# Patient Record
Sex: Male | Born: 1965 | Race: White | Hispanic: No | Marital: Married | Smoking: Never smoker
Health system: Southern US, Community
[De-identification: ages and names within clinical notes are randomized; demographics above are authoritative.]

## PROBLEM LIST (undated history)

## (undated) DIAGNOSIS — I2699 Other pulmonary embolism without acute cor pulmonale: Secondary | ICD-10-CM

## (undated) DIAGNOSIS — C189 Malignant neoplasm of colon, unspecified: Secondary | ICD-10-CM

## (undated) DIAGNOSIS — D126 Benign neoplasm of colon, unspecified: Secondary | ICD-10-CM

## (undated) DIAGNOSIS — D649 Anemia, unspecified: Secondary | ICD-10-CM

## (undated) DIAGNOSIS — T7840XA Allergy, unspecified, initial encounter: Secondary | ICD-10-CM

## (undated) HISTORY — DX: Malignant neoplasm of colon, unspecified: C18.9

## (undated) HISTORY — PX: WISDOM TOOTH EXTRACTION: SHX21

## (undated) HISTORY — DX: Allergy, unspecified, initial encounter: T78.40XA

## (undated) HISTORY — DX: Other pulmonary embolism without acute cor pulmonale: I26.99

## (undated) HISTORY — DX: Benign neoplasm of colon, unspecified: D12.6

## (undated) HISTORY — DX: Anemia, unspecified: D64.9

## (undated) HISTORY — PX: POLYPECTOMY: SHX149

## (undated) HISTORY — PX: COLON SURGERY: SHX602

---

## 2004-06-14 ENCOUNTER — Ambulatory Visit (HOSPITAL_COMMUNITY): Admission: RE | Admit: 2004-06-14 | Discharge: 2004-06-14 | Payer: Self-pay | Admitting: Urology

## 2004-06-14 ENCOUNTER — Encounter (INDEPENDENT_AMBULATORY_CARE_PROVIDER_SITE_OTHER): Payer: Self-pay | Admitting: Specialist

## 2004-06-14 ENCOUNTER — Ambulatory Visit (HOSPITAL_BASED_OUTPATIENT_CLINIC_OR_DEPARTMENT_OTHER): Admission: RE | Admit: 2004-06-14 | Discharge: 2004-06-14 | Payer: Self-pay | Admitting: Urology

## 2004-08-06 ENCOUNTER — Ambulatory Visit: Payer: Self-pay | Admitting: Family Medicine

## 2005-07-14 ENCOUNTER — Ambulatory Visit: Payer: Self-pay | Admitting: Family Medicine

## 2005-10-13 ENCOUNTER — Ambulatory Visit: Payer: Self-pay | Admitting: Family Medicine

## 2005-10-16 ENCOUNTER — Ambulatory Visit: Payer: Self-pay

## 2007-05-13 ENCOUNTER — Telehealth: Payer: Self-pay | Admitting: Family Medicine

## 2008-02-08 ENCOUNTER — Ambulatory Visit: Payer: Self-pay | Admitting: Family Medicine

## 2008-02-08 ENCOUNTER — Ambulatory Visit: Payer: Self-pay | Admitting: Cardiology

## 2008-02-08 DIAGNOSIS — J45909 Unspecified asthma, uncomplicated: Secondary | ICD-10-CM | POA: Insufficient documentation

## 2008-02-08 DIAGNOSIS — G500 Trigeminal neuralgia: Secondary | ICD-10-CM

## 2008-02-16 ENCOUNTER — Telehealth: Payer: Self-pay | Admitting: Family Medicine

## 2008-02-25 ENCOUNTER — Ambulatory Visit: Payer: Self-pay | Admitting: Family Medicine

## 2008-06-09 ENCOUNTER — Ambulatory Visit: Payer: Self-pay | Admitting: Family Medicine

## 2008-06-09 DIAGNOSIS — J209 Acute bronchitis, unspecified: Secondary | ICD-10-CM | POA: Insufficient documentation

## 2010-02-27 ENCOUNTER — Ambulatory Visit: Payer: Self-pay | Admitting: Family Medicine

## 2010-02-27 DIAGNOSIS — R109 Unspecified abdominal pain: Secondary | ICD-10-CM | POA: Insufficient documentation

## 2010-02-28 ENCOUNTER — Encounter: Payer: Self-pay | Admitting: Internal Medicine

## 2010-04-02 ENCOUNTER — Ambulatory Visit: Payer: Self-pay | Admitting: Internal Medicine

## 2010-04-02 DIAGNOSIS — R195 Other fecal abnormalities: Secondary | ICD-10-CM

## 2010-04-02 DIAGNOSIS — R1084 Generalized abdominal pain: Secondary | ICD-10-CM

## 2010-04-02 DIAGNOSIS — R1012 Left upper quadrant pain: Secondary | ICD-10-CM | POA: Insufficient documentation

## 2010-04-03 ENCOUNTER — Ambulatory Visit: Payer: Self-pay | Admitting: Internal Medicine

## 2010-04-03 ENCOUNTER — Encounter (INDEPENDENT_AMBULATORY_CARE_PROVIDER_SITE_OTHER): Payer: Self-pay | Admitting: *Deleted

## 2010-04-03 DIAGNOSIS — D509 Iron deficiency anemia, unspecified: Secondary | ICD-10-CM

## 2010-04-03 LAB — CONVERTED CEMR LAB
Basophils Absolute: 0 10*3/uL (ref 0.0–0.1)
Basophils Relative: 0.3 % (ref 0.0–3.0)
Eosinophils Absolute: 0.1 10*3/uL (ref 0.0–0.7)
Eosinophils Relative: 3.1 % (ref 0.0–5.0)
Ferritin: 2 ng/mL — ABNORMAL LOW (ref 22.0–322.0)
Folate: 11.3 ng/mL
HCT: 26.2 % — ABNORMAL LOW (ref 39.0–52.0)
Hemoglobin: 8.3 g/dL — ABNORMAL LOW (ref 13.0–17.0)
Iron: 16 ug/dL — ABNORMAL LOW (ref 42–165)
Lymphocytes Relative: 26.1 % (ref 12.0–46.0)
Lymphs Abs: 1.2 10*3/uL (ref 0.7–4.0)
MCHC: 31.5 g/dL (ref 30.0–36.0)
MCV: 62.9 fL — ABNORMAL LOW (ref 78.0–100.0)
Monocytes Absolute: 0.4 10*3/uL (ref 0.1–1.0)
Monocytes Relative: 8.7 % (ref 3.0–12.0)
Neutro Abs: 2.9 10*3/uL (ref 1.4–7.7)
Neutrophils Relative %: 61.8 % (ref 43.0–77.0)
Platelets: 251 10*3/uL (ref 150.0–400.0)
RBC: 4.17 M/uL — ABNORMAL LOW (ref 4.22–5.81)
RDW: 17.2 % — ABNORMAL HIGH (ref 11.5–14.6)
Vitamin B-12: 381 pg/mL (ref 211–911)
WBC: 4.8 10*3/uL (ref 4.5–10.5)

## 2010-04-06 DIAGNOSIS — C189 Malignant neoplasm of colon, unspecified: Secondary | ICD-10-CM

## 2010-04-06 HISTORY — DX: Malignant neoplasm of colon, unspecified: C18.9

## 2010-04-10 ENCOUNTER — Emergency Department (HOSPITAL_COMMUNITY): Admission: EM | Admit: 2010-04-10 | Discharge: 2010-04-10 | Payer: Self-pay | Admitting: Emergency Medicine

## 2010-04-10 ENCOUNTER — Telehealth: Payer: Self-pay | Admitting: Internal Medicine

## 2010-04-16 ENCOUNTER — Telehealth: Payer: Self-pay | Admitting: Internal Medicine

## 2010-04-16 ENCOUNTER — Ambulatory Visit: Payer: Self-pay | Admitting: Internal Medicine

## 2010-04-16 DIAGNOSIS — R933 Abnormal findings on diagnostic imaging of other parts of digestive tract: Secondary | ICD-10-CM

## 2010-04-17 ENCOUNTER — Telehealth: Payer: Self-pay | Admitting: Internal Medicine

## 2010-04-18 ENCOUNTER — Ambulatory Visit: Payer: Self-pay | Admitting: Cardiology

## 2010-04-18 ENCOUNTER — Ambulatory Visit: Payer: Self-pay | Admitting: Internal Medicine

## 2010-04-19 LAB — CONVERTED CEMR LAB: CEA: 2.9 ng/mL (ref 0.0–5.0)

## 2010-05-03 ENCOUNTER — Encounter: Payer: Self-pay | Admitting: Family Medicine

## 2010-05-07 HISTORY — PX: COLON RESECTION: SHX5231

## 2010-05-21 ENCOUNTER — Encounter (INDEPENDENT_AMBULATORY_CARE_PROVIDER_SITE_OTHER): Payer: Self-pay | Admitting: General Surgery

## 2010-05-21 ENCOUNTER — Encounter: Payer: Self-pay | Admitting: Internal Medicine

## 2010-05-21 ENCOUNTER — Inpatient Hospital Stay (HOSPITAL_COMMUNITY): Admission: RE | Admit: 2010-05-21 | Discharge: 2010-05-25 | Payer: Self-pay | Admitting: General Surgery

## 2010-05-24 ENCOUNTER — Telehealth (INDEPENDENT_AMBULATORY_CARE_PROVIDER_SITE_OTHER): Payer: Self-pay | Admitting: *Deleted

## 2010-05-28 ENCOUNTER — Ambulatory Visit: Payer: Self-pay | Admitting: Oncology

## 2010-06-06 ENCOUNTER — Encounter: Payer: Self-pay | Admitting: Family Medicine

## 2010-06-07 ENCOUNTER — Encounter: Payer: Self-pay | Admitting: Family Medicine

## 2010-06-19 ENCOUNTER — Encounter: Payer: Self-pay | Admitting: Family Medicine

## 2010-06-19 LAB — CBC WITH DIFFERENTIAL/PLATELET
Basophils Absolute: 0.1 10*3/uL (ref 0.0–0.1)
Eosinophils Absolute: 0.2 10*3/uL (ref 0.0–0.5)
HCT: 32.4 % — ABNORMAL LOW (ref 38.4–49.9)
HGB: 10.3 g/dL — ABNORMAL LOW (ref 13.0–17.1)
LYMPH%: 28.2 % (ref 14.0–49.0)
MCV: 64 fL — ABNORMAL LOW (ref 79.3–98.0)
MONO%: 9 % (ref 0.0–14.0)
NEUT#: 2.8 10*3/uL (ref 1.5–6.5)
NEUT%: 56.8 % (ref 39.0–75.0)
Platelets: 173 10*3/uL (ref 140–400)
RBC: 5.07 10*6/uL (ref 4.20–5.82)

## 2010-06-27 ENCOUNTER — Ambulatory Visit (HOSPITAL_COMMUNITY)
Admission: RE | Admit: 2010-06-27 | Discharge: 2010-06-27 | Payer: Self-pay | Source: Home / Self Care | Attending: Oncology | Admitting: Oncology

## 2010-07-11 ENCOUNTER — Ambulatory Visit: Payer: Self-pay | Admitting: Oncology

## 2010-07-15 LAB — COMPREHENSIVE METABOLIC PANEL
ALT: 11 U/L (ref 0–53)
AST: 14 U/L (ref 0–37)
Albumin: 4.3 g/dL (ref 3.5–5.2)
Alkaline Phosphatase: 83 U/L (ref 39–117)
BUN: 14 mg/dL (ref 6–23)
CO2: 26 mEq/L (ref 19–32)
Calcium: 9.5 mg/dL (ref 8.4–10.5)
Chloride: 106 mEq/L (ref 96–112)
Creatinine, Ser: 0.86 mg/dL (ref 0.40–1.50)
Glucose, Bld: 94 mg/dL (ref 70–99)
Potassium: 4.1 mEq/L (ref 3.5–5.3)
Sodium: 141 mEq/L (ref 135–145)
Total Bilirubin: 0.6 mg/dL (ref 0.3–1.2)
Total Protein: 6.8 g/dL (ref 6.0–8.3)

## 2010-07-15 LAB — CBC WITH DIFFERENTIAL/PLATELET
BASO%: 0.4 % (ref 0.0–2.0)
Basophils Absolute: 0 10*3/uL (ref 0.0–0.1)
EOS%: 4.3 % (ref 0.0–7.0)
Eosinophils Absolute: 0.2 10*3/uL (ref 0.0–0.5)
HCT: 35.7 % — ABNORMAL LOW (ref 38.4–49.9)
HGB: 11.3 g/dL — ABNORMAL LOW (ref 13.0–17.1)
LYMPH%: 30.6 % (ref 14.0–49.0)
MCH: 21.6 pg — ABNORMAL LOW (ref 27.2–33.4)
MCHC: 31.7 g/dL — ABNORMAL LOW (ref 32.0–36.0)
MCV: 68.2 fL — ABNORMAL LOW (ref 79.3–98.0)
MONO#: 0.4 10*3/uL (ref 0.1–0.9)
MONO%: 9.2 % (ref 0.0–14.0)
NEUT#: 2.7 10*3/uL (ref 1.5–6.5)
NEUT%: 55.5 % (ref 39.0–75.0)
Platelets: 208 10*3/uL (ref 140–400)
RBC: 5.23 10*6/uL (ref 4.20–5.82)
RDW: 26.1 % — ABNORMAL HIGH (ref 11.0–14.6)
WBC: 4.8 10*3/uL (ref 4.0–10.3)
lymph#: 1.5 10*3/uL (ref 0.9–3.3)

## 2010-07-15 LAB — FERRITIN: Ferritin: 5 ng/mL — ABNORMAL LOW (ref 22–322)

## 2010-07-25 ENCOUNTER — Encounter: Payer: Self-pay | Admitting: Internal Medicine

## 2010-08-05 LAB — CBC WITH DIFFERENTIAL/PLATELET
Basophils Absolute: 0 10*3/uL (ref 0.0–0.1)
Eosinophils Absolute: 0.3 10*3/uL (ref 0.0–0.5)
HGB: 12.4 g/dL — ABNORMAL LOW (ref 13.0–17.1)
MCV: 73.5 fL — ABNORMAL LOW (ref 79.3–98.0)
MONO%: 11.7 % (ref 0.0–14.0)
NEUT#: 3 10*3/uL (ref 1.5–6.5)
Platelets: 201 10*3/uL (ref 140–400)
RDW: 31.2 % — ABNORMAL HIGH (ref 11.0–14.6)

## 2010-08-06 NOTE — Procedures (Signed)
Summary: Colonoscopy  Patient: Brian Cantrell Note: All result statuses are Final unless otherwise noted.  Tests: (1) Colonoscopy (COL)   COL Colonoscopy           DONE     [facility]     [address]     [place], [place]  [address]           COLONOSCOPY PROCEDURE REPORT           PATIENT:  [name], [name]  MR#:  [id number]     BIRTHDATE:  July 28, 1965, 44 yrs. old  GENDER:  male     ENDOSCOPIST:  Wilhemina Bonito. Eda Keys, MD     REF. BY:  Tera Mater. Clent Ridges, M.D.     PROCEDURE DATE:  04/16/2010     PROCEDURE:  Colonoscopy with biopsies,     Colonoscopy with snare polypectomy     x 1     ASA CLASS:  Class I     INDICATIONS:  heme positive stool, Iron deficiency anemia,     Abdominal pain     MEDICATIONS:   Fentanyl 100 mcg IV, Versed 10 mg IV           DESCRIPTION OF PROCEDURE:   After the risks benefits and     alternatives of the procedure were thoroughly explained, informed     consent was obtained.  Digital rectal exam was performed and     revealed no abnormalities.   The [id number] endoscope was     introduced through the anus and advanced to the sigmoid colon,     limited by an obstruction.    The quality of the prep was     excellent, using MoviPrep.  The instrument was then slowly     withdrawn as the colon was fully examined.     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 The scope could     not pass proximal. Multiple biopsies taken.  A 7mm pedunculated     polyp was found in the sigmoid colon. Polyp was snared without     cautery. Retrieval was successful.    Retroflexed views in the     rectum revealed no abnormalities.    The scope was then withdrawn     from the patient and the procedure completed.           COMPLICATIONS:  None           ENDOSCOPIC IMPRESSION:     1) Mass in the sigmoid colon - biopsied     2) Pedunculated polyp in the sigmoid colon -  removed     3) Incomplete examination of colon due to obstruction, see above           RECOMMENDATIONS:     1) Follow up biopsies     2) My office will arrange for you to have a Contrast CT scan of     abdomen and pelvis " Sigmoid colon mass, R/O mets" .     3) CEA level     3) My office will arrange for you to meet with a general surgeon     "sigmoid colectomy".     4) Follow up colonoscopy in 6-12 months           ______________________________     Wilhemina Bonito. Marina Goodell  Montez Hageman, MD           CC:  Nelwyn Salisbury, MD;  The Patient;  [facility]           n.     eSIGNED:   Wilhemina Bonito. Eda Keys at 04/16/2010 09:48 AM           [place], Onalee Hua, [id number]  Note: An exclamation mark (!) indicates a result that was not dispersed into the flowsheet. Document Creation Date: 04/16/2010 9:48 AM _______________________________________________________________________  (1) Order result status: Final Collection or observation date-time: 04/16/2010 09:32 Requested date-time:  Receipt date-time:  Reported date-time:  Referring Physician:   Ordering Physician: Fransico Setters [phone]) Specimen Source:  Source: Launa Grill Order Number: [id number] Lab site:   Appended Document: Orders Update-CT Abd/Pelvis    Clinical Lists Changes  Problems: Added new problem of NONSPECIFIC ABN FINDING RAD & OTH EXAM GI TRACT (ICD-793.4) - Signed Orders: Added new Referral order of CT Abdomen/Pelvis with Contrast (CT Abd/Pelvis w/con) - Signed Added new Test order of [facility] ([facility]) - Signed      Appended Document: Colonoscopy All records faxed to [facility].

## 2010-08-06 NOTE — Letter (Signed)
Summary: New Patient letter  [facility]  [address] [place], [place] [address]   Phone: [phone]  Fax: [phone]       02/28/2010 MRN: [id number]  Brian Cantrell [address] [place], [place]  [address]  Dear [name],  Welcome to the Gastroenterology Division at [facility].    You are scheduled to see Dr.  Yancey Flemings on 04-02-10 at 1:45pm on the 3rd floor at [facility], [address]  We ask that you try to arrive at our office 15 minutes prior to your appointment time to allow for check-in.  We would like you to complete the enclosed self-administered evaluation form prior to your visit and bring it with you on the day of your appointment.  We will review it with you.  Also, please bring a complete list of all your medications or, if you prefer, bring the medication bottles and we will list them.  Please bring your insurance card so that we may make a copy of it.  If your insurance requires a referral to see a specialist, please bring your referral form from your primary care physician.  Co-payments are due at the time of your visit and may be paid by cash, check or credit card.     Your office visit will consist of a consult with your physician (includes a physical exam), any laboratory testing he/she may order, scheduling of any necessary diagnostic testing (e.g. x-ray, ultrasound, CT-scan), and scheduling of a procedure (e.g. Endoscopy, Colonoscopy) if required.  Please allow enough time on your schedule to allow for any/all of these possibilities.    If you cannot keep your appointment, please call [phone] to cancel or reschedule prior to your appointment date.  This allows [place] the opportunity to schedule an appointment for another patient in need of care.  If you do not cancel or reschedule by 5 p.m. the business day prior to your appointment date, you will be charged a $50.00 late cancellation/no-show fee.    Thank you for  choosing [facility] for your medical needs.  We appreciate the opportunity to care for you.  Please visit [place] at our website  to learn more about our practice.                     Sincerely,                                                             The Gastroenterology Division

## 2010-08-06 NOTE — Letter (Signed)
Summary: [facility] Instructions  [facility]  [address] [place], [place] [address]   Phone: [phone]  Fax: [phone]       Brian Cantrell    1965/11/16    MRN: [id number]        Procedure Day /Date:04/16/10 TUE     Arrival Time:730 am     Procedure Time:830 am     Location of Procedure:                    X  [facility] (4th Floor)                        PREPARATION FOR COLONOSCOPY WITH MOVIPREP   Starting 5 days prior to your procedure 04/11/10  do not eat nuts, seeds, popcorn, corn, beans, peas,  salads, or any raw vegetables.  Do not take any fiber supplements (e.g. Metamucil, Citrucel, and Benefiber).  THE DAY BEFORE YOUR PROCEDURE         DATE: 04/15/10  DAY: MON  1.  Drink clear liquids the entire day-NO SOLID FOOD  2.  Do not drink anything colored red or purple.  Avoid juices with pulp.  No orange juice.  3.  Drink at least 64 oz. (8 glasses) of fluid/clear liquids during the day to prevent dehydration and help the prep work efficiently.  CLEAR LIQUIDS INCLUDE: Water Jello Ice Popsicles Tea (sugar ok, no milk/cream) Powdered fruit flavored drinks Coffee (sugar ok, no milk/cream) Gatorade Juice: apple, white grape, white cranberry  Lemonade Clear bullion, consomm, broth Carbonated beverages (any kind) Strained chicken noodle soup Hard Candy                             4.  In the morning, mix first dose of MoviPrep solution:    Empty 1 Pouch A and 1 Pouch B into the disposable container    Add lukewarm drinking water to the top line of the container. Mix to dissolve    Refrigerate (mixed solution should be used within 24 hrs)  5.  Begin drinking the prep at 5:00 p.m. The MoviPrep container is divided by 4 marks.   Every 15 minutes drink the solution down to the next mark (approximately 8 oz) until the full liter is complete.   6.  Follow completed prep with 16 oz of clear liquid of your choice (Nothing red or  purple).  Continue to drink clear liquids until bedtime.  7.  Before going to bed, mix second dose of MoviPrep solution:    Empty 1 Pouch A and 1 Pouch B into the disposable container    Add lukewarm drinking water to the top line of the container. Mix to dissolve    Refrigerate  THE DAY OF YOUR PROCEDURE      DATE: 04/16/10 DAY: TUE  Beginning at 3 a.m. (5 hours before procedure):         1. Every 15 minutes, drink the solution down to the next mark (approx 8 oz) until the full liter is complete.  2. Follow completed prep with 16 oz. of clear liquid of your choice.    3. You may drink clear liquids until 6 am (2 HOURS BEFORE PROCEDURE).   MEDICATION INSTRUCTIONS  Unless otherwise instructed, you should take regular prescription medications with a small sip of water   as early as possible the morning of your procedure.  Additional medication instructions: Stop taking your Iron 5-7 days before your procedure         OTHER INSTRUCTIONS  You will need a responsible adult at least 47 years of age to accompany you and drive you home.   This person must remain in the waiting room during your procedure.  Wear loose fitting clothing that is easily removed.  Leave jewelry and other valuables at home.  However, you may wish to bring a book to read or  an iPod/MP3 player to listen to music as you wait for your procedure to start.  Remove all body piercing jewelry and leave at home.  Total time from sign-in until discharge is approximately 2-3 hours.  You should go home directly after your procedure and rest.  You can resume normal activities the  day after your procedure.  The day of your procedure you should not:   Drive   Make legal decisions   Operate machinery   Drink alcohol   Return to work  You will receive specific instructions about eating, activities and medications before you leave.    The above instructions have been reviewed and explained to me by    Chales Abrahams CMA Duncan Dull)  April 03, 2010 2:09 PM     I fully understand and can verbalize these instructions over the phone mailed to home Date 04/03/10  Appended Document: Moviprep Instructions mailed to the pt

## 2010-08-06 NOTE — Progress Notes (Signed)
Summary: triage / ABD PAIN  Phone Note Call from Patient Call back at Home Phone [phone]   Caller: Patient Call For: Dr. Marina Goodell Reason for Call: Talk to Nurse Summary of Call: in severe abd pain x 3-4 hrs Initial call taken by: Vallarie Mare,  April 10, 2010 1:33 PM  Follow-up for Phone Call        pt having generalized abd pain starting on Friday night lasting several hours, started again this morning at 930 am and has not subsided.  Had a sip of water at 10 am that made the pain worse.  He feels lightheaded and nauseaous.  Has had several very intense salivating episodes.  Pain is a 8-9 at the worst.  Pt is not passing gas, or having any bowel movements.  I advised pt to be seen at the ER. He is going to have someone drive him.   Follow-up by: Chales Abrahams CMA Duncan Dull),  April 10, 2010 1:51 PM  Additional Follow-up for Phone Call Additional follow up Details #1::        AGREE Additional Follow-up by: Hilarie Fredrickson MD,  April 10, 2010 2:09 PM     Appended Document: triage / ABD PAIN He was going to the ER I look for records to see if he actually went.

## 2010-08-06 NOTE — Letter (Signed)
Summary: [facility] Instructions  [facility]  [address] [place], [place] [address]   Phone: [phone]  Fax: [phone]       Brian Cantrell    07-17-1965    MRN: [id number]        Procedure Day Dorna Bloom: Wednesday November 2nd, 2011     Arrival Time: 3:00pm     Procedure Time: 4:00pm     Location of Procedure:                    _ x_  [facility] (4th Floor)                        PREPARATION FOR COLONOSCOPY WITH MOVIPREP   Starting 5 days prior to your procedure9/29/11 do not eat nuts, seeds, popcorn, corn, beans, peas,  salads, or any raw vegetables.  Do not take any fiber supplements (e.g. Metamucil, Citrucel, and Benefiber).  THE DAY BEFORE YOUR PROCEDURE         DATE: 05/07/10 DAY: Tuesday  1.  Drink clear liquids the entire day-NO SOLID FOOD  2.  Do not drink anything colored red or purple.  Avoid juices with pulp.  No orange juice.  3.  Drink at least 64 oz. (8 glasses) of fluid/clear liquids during the day to prevent dehydration and help the prep work efficiently.  CLEAR LIQUIDS INCLUDE: Water Jello Ice Popsicles Tea (sugar ok, no milk/cream) Powdered fruit flavored drinks Coffee (sugar ok, no milk/cream) Gatorade Juice: apple, white grape, white cranberry  Lemonade Clear bullion, consomm, broth Carbonated beverages (any kind) Strained chicken noodle soup Hard Candy                             4.  In the morning, mix first dose of MoviPrep solution:    Empty 1 Pouch A and 1 Pouch B into the disposable container    Add lukewarm drinking water to the top line of the container. Mix to dissolve    Refrigerate (mixed solution should be used within 24 hrs)  5.  Begin drinking the prep at 5:00 p.m. The MoviPrep container is divided by 4 marks.   Every 15 minutes drink the solution down to the next mark (approximately 8 oz) until the full liter is complete.   6.  Follow completed prep with 16 oz of clear liquid of your choice  (Nothing red or purple).  Continue to drink clear liquids until bedtime.  7.  Before going to bed, mix second dose of MoviPrep solution:    Empty 1 Pouch A and 1 Pouch B into the disposable container    Add lukewarm drinking water to the top line of the container. Mix to dissolve    Refrigerate  THE DAY OF YOUR PROCEDURE      DATE: 05/08/10 DAY: Wednesday  Beginning at 11:00 a.m. (5 hours before procedure):         1. Every 15 minutes, drink the solution down to the next mark (approx 8 oz) until the full liter is complete.  2. Follow completed prep with 16 oz. of clear liquid of your choice.    3. You may drink clear liquids until 2:00pm (2 HOURS BEFORE PROCEDURE).   MEDICATION INSTRUCTIONS  Unless otherwise instructed, you should take regular prescription medications with a small sip of water   as early as possible the morning of your procedure.  OTHER INSTRUCTIONS  You will need a responsible adult at least 47 years of age to accompany you and drive you home.   This person must remain in the waiting room during your procedure.  Wear loose fitting clothing that is easily removed.  Leave jewelry and other valuables at home.  However, you may wish to bring a book to read or  an iPod/MP3 player to listen to music as you wait for your procedure to start.  Remove all body piercing jewelry and leave at home.  Total time from sign-in until discharge is approximately 2-3 hours.  You should go home directly after your procedure and rest.  You can resume normal activities the  day after your procedure.  The day of your procedure you should not:   Drive   Make legal decisions   Operate machinery   Drink alcohol   Return to work  You will receive specific instructions about eating, activities and medications before you leave.    The above instructions have been reviewed and explained to me by   Brian Cantrell.     I fully understand and can verbalize these  instructions _____________________________ Date _________

## 2010-08-06 NOTE — Progress Notes (Signed)
  Phone Note Other Incoming   Request: Send information Summary of Call: Records received from [facility] forwarded to Dr. Marina Goodell.        Appended Document:  3 pages

## 2010-08-06 NOTE — Progress Notes (Signed)
Summary: Dr Luisa Hart called  Phone Note Other Incoming   Caller: DR Luisa Hart -Pathology [phone] Reason for Call: Discuss lab or test results Summary of Call: Did not want Dr Marina Goodell interrupted if with a patient. Initial call taken by: Leanor Kail [facility],  April 17, 2010 2:56 PM  Follow-up for Phone Call        I spoke with him. Thanks Follow-up by: Hilarie Fredrickson MD,  April 17, 2010 3:07 PM

## 2010-08-06 NOTE — Assessment & Plan Note (Signed)
Summary: STOMACH ISSUES // RS   Vital Signs:  Patient profile:   45 year old male Weight:      204 pounds BP sitting:   110 / 78  (left arm) Cuff size:   regular  Vitals Entered By: Raechel Ache, RN (February 27, 2010 8:45 AM) CC: C/o lower abd pain x 2 1/2 weeks- was severe and almost called 911 with vomiting. Has since gotten better but continues. Also has lost 10#. BM's were loose and now constipated.   History of Present Illness: Here for 2 weeks of lower abdominal pains, bloating, and changes in BMs. He is mostly constipated, with small firm stools. he often feels like he needs to have a BM but cannot. No fever. He had one episode of nausea and vomitting 2 weeks ago after eating at a restaurant, but none since. No urinary symptoms. He says he has been under tremendous stress at work for several weeks.   Allergies (verified): No Known Drug Allergies  Past History:  Past Medical History: Reviewed history from 02/08/2008 and no changes required. Asthma  Past Surgical History: Reviewed history from 02/08/2008 and no changes required. Denies surgical history  Family History: Reviewed history from 02/08/2008 and no changes required. Family History of CAD Male 1st degree relative <50  Review of Systems  The patient denies anorexia, fever, weight loss, weight gain, vision loss, decreased hearing, hoarseness, chest pain, syncope, dyspnea on exertion, peripheral edema, prolonged cough, headaches, hemoptysis, melena, hematochezia, severe indigestion/heartburn, hematuria, incontinence, genital sores, muscle weakness, suspicious skin lesions, transient blindness, difficulty walking, depression, unusual weight change, abnormal bleeding, enlarged lymph nodes, angioedema, breast masses, and testicular masses.    Physical Exam  General:  Well-developed,well-nourished,in no acute distress; alert,appropriate and cooperative throughout examination Lungs:  Normal respiratory effort, chest  expands symmetrically. Lungs are clear to auscultation, no crackles or wheezes. Heart:  Normal rate and regular rhythm. S1 and S2 normal without gallop, murmur, click, rub or other extra sounds. Abdomen:  soft, normal bowel sounds, no distention, no masses, no guarding, no rigidity, no rebound tenderness, no abdominal hernia, no inguinal hernia, no hepatomegaly, and no splenomegaly.  Mildly tender in both lower quadrants. Rectal:  No external abnormalities noted. Normal sphincter tone. No rectal masses or tenderness. Heme positive. Prostate:  Prostate gland firm and smooth, no enlargement, nodularity, tenderness, mass, asymmetry or induration.   Impression & Recommendations:  Problem # 1:  ABDOMINAL PAIN (ICD-789.00)  Orders: Hemoccult Guaiac-1 spec.(in office) ([address]) Gastroenterology Referral (GI)  Patient Instructions: 1)  This could be consistent with IBS, but with the recent onset of changes and heme positive stool I think a colonoscopy would be a good idea. We will set this up. I advised more fiber in his diet.

## 2010-08-06 NOTE — Assessment & Plan Note (Addendum)
Summary: ABD PAIN & HEM POSITIVE STOOLS   History of Present Illness Visit Type: consult Primary GI MD: Brian Flemings MD Primary Provider: Gershon Cantrell, M.D. Requesting Provider: Gershon Crane, MD Chief Complaint: lower abdominal pain that is improving, heme + stool at Dr. Claris Cantrell office History of Present Illness:   10 or old white male with a history of asthma who presents today regarding recent problems with abdominal pain and Hemoccult-positive stool. Patient's history dates back to July when after eating a meal he developed significant lower abdominal pain. This was associated with vomiting. He assumed was food poisoning. Thereafter, problems with intermittent lower abdominal pain and change in bowel habits. Initially easy somewhat more frequent bowel habits followed by constipation. Has continued with an alternating but improving pattern since. No obvious bleeding. He has had 14 pound weight loss since late July, the future dates this to diet. Physical examination with his primary care physician revealed Hemoccult positive stool. Patient's GI review of systems is otherwise negative. No family history of colon cancer. No prior history of GI evaluations.. No NSAID use.   GI Review of Systems    Reports abdominal pain.     Location of  Abdominal pain: lower abdomen.    Denies acid reflux, belching, bloating, chest pain, dysphagia with liquids, dysphagia with solids, heartburn, loss of appetite, nausea, vomiting, vomiting blood, weight loss, and  weight gain.      Reports change in bowel habits and  constipation.     Denies anal fissure, black tarry stools, diarrhea, diverticulosis, fecal incontinence, heme positive stool, hemorrhoids, irritable bowel syndrome, jaundice, light color stool, liver problems, rectal bleeding, and  rectal pain.    Current Medications (verified): 1)  Green Vibrance Powder .... Use Daily  Allergies (verified): No Known Drug Allergies  Past History:  Past Medical  History: Reviewed history from 02/08/2008 and no changes required. Asthma  Past Surgical History: Reviewed history from 02/08/2008 and no changes required. 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 Alcohol use-yes Drug use-no Daily Caffeine Use 0.25 cups  Review of Systems  The patient denies allergy/sinus, anemia, anxiety-new, arthritis/joint pain, back pain, blood in urine, breast changes/lumps, confusion, cough, coughing up blood, depression-new, fainting, fatigue, fever, headaches-new, hearing problems, heart murmur, heart rhythm changes, itching, muscle pains/cramps, night sweats, nosebleeds, shortness of breath, skin rash, sleeping problems, sore throat, swelling of feet/legs, swollen lymph glands, thirst - excessive, urination - excessive, urination changes/pain, urine leakage, vision changes, and voice change.    Vital Signs:  Patient profile:   45 year old male Height:      73 inches Weight:      203 pounds BMI:     26.88 Pulse rate:   76 / minute Pulse rhythm:   regular BP sitting:   120 / 70  (left arm) Cuff size:   regular  Vitals Entered By: Brian Cantrell CMA Brian Cantrell) (April 02, 2010 1:58 PM)  Physical Exam  General:  Well developed, well nourished, no acute distress. Head:  Normocephalic and atraumatic. Eyes:  PERRLA, no icterus. Ears:  Normal auditory acuity. Nose:  No deformity, discharge,  or lesions. Mouth:  No deformity or lesions, dentition normal. Neck:  Supple; no masses or thyromegaly. Lungs:  Clear throughout to auscultation. Heart:  Regular rate and rhythm; no murmurs, rubs,  or bruits. Abdomen:  Soft, nontender and nondistended.  No masses, hepatosplenomegaly or hernias noted. Normal bowel sounds. Rectal:  deferred until colonoscopy Msk:  Symmetrical with no gross deformities. Normal  posture. Pulses:  Normal pulses noted. Extremities:  No clubbing, cyanosis, edema or deformities noted. Neurologic:  Alert and  oriented x4. Skin:  Intact without significant lesions or rashes. Psych:  Alert and cooperative. Normal mood and affect.   Impression & Recommendations:  Problem # 1:  ABDOMINAL PAIN -GENERALIZED (ICD-789.07) recent problems with abdominal discomfort and associated change in bowel habits (787.99). Problem has improved with time. 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 Recommend ongoing expectant management.  Problem # 2:  NONSPECIFIC ABNORMAL FINDING IN STOOL CONTENTS (ICD-792.1) Hemoccult-positive stool in a gentleman with abdominal discomfort and altered bowel habits as described. Rule out underlying mucosal pathology. Rule out anemia.  Plan: #1. CBC #2. Colonoscopy. The nature of the procedure as well as the risks, benefits, and alternatives have been reviewed. He understood and agreed to proceed. Movi prep prescribed. Patient instructed on its use. #3. May need EGD if colonoscopy negative AND anemic. We discussed this  Other Orders: TLB-CBC Platelet - w/Differential (85025-CBCD) Colonoscopy (Colon)  Patient Instructions: 1)  Get your labs drawn today in the basement.  2)  Colonoscopy and Flexible Sigmoidoscopy brochure given.  3)  Pick up your prep from your pharmacy.  4)  Copy sent to : Brian Crane, MD 5)  The medication list was reviewed and reconciled.  All changed / newly prescribed medications were explained.  A complete medication list was provided to the patient / caregiver. Prescriptions: MOVIPREP 100 GM  SOLR (PEG-KCL-NACL-NASULF-NA ASC-C) As per prep instructions.  #1 x 0   Entered by:   Brian Cantrell CMA (AAMA)   Authorized by:   Brian Fredrickson MD   Signed by:   Brian Cantrell CMA (AAMA) on 04/02/2010   Method used:   Electronically to        [phone]* (retail)       [address] [place], [place]  [address]       Ph: [phone] or [phone]       Fax: [phone]   RxID:   [id number]

## 2010-08-06 NOTE — Progress Notes (Signed)
Summary: Please Call patient  Phone Note Outgoing Call   Call placed by: Milford Cage NCMA,  April 16, 2010 11:36 AM Call placed to: Patient Summary of Call: Called patient with CT and [facility]CS dates and time with instructions to come by here and pick up contrast and have labs.  He would like for you to give him a courtesy call if you could today.  [phone]. Initial call taken by: Milford Cage NCMA,  April 16, 2010 11:38 AM  Follow-up for Phone Call        Discussed his case w/ him and answered questions. Currently for CT 10-13 and [facility] eval 10-28. Follow-up by: Hilarie Fredrickson MD,  April 16, 2010 5:36 PM

## 2010-08-06 NOTE — Consult Note (Signed)
Summary: [facility]   Imported By: Maryln Gottron 05/28/2010 15:02:24  _____________________________________________________________________  External Attachment:    Type:   Image     Comment:   External Document

## 2010-08-08 NOTE — Letter (Signed)
Summary: [facility]  [facility]   Imported By: Maryln Gottron 07/04/2010 14:02:39  _____________________________________________________________________  External Attachment:    Type:   Image     Comment:   External Document

## 2010-08-08 NOTE — Letter (Signed)
Summary: [facility]   Imported By: Maryln Gottron 06/20/2010 14:21:00  _____________________________________________________________________  External Attachment:    Type:   Image     Comment:   External Document

## 2010-08-08 NOTE — Letter (Signed)
Summary: [facility]   [facility]   Imported By: Maryln Gottron 06/18/2010 10:40:02  _____________________________________________________________________  External Attachment:    Type:   Image     Comment:   External Document

## 2010-08-22 NOTE — Letter (Signed)
Summary: [facility]   Imported By: Lennie Odor 08/13/2010 11:45:29  _____________________________________________________________________  External Attachment:    Type:   Image     Comment:   External Document

## 2010-08-28 ENCOUNTER — Other Ambulatory Visit: Payer: Self-pay | Admitting: Oncology

## 2010-08-28 ENCOUNTER — Encounter (HOSPITAL_BASED_OUTPATIENT_CLINIC_OR_DEPARTMENT_OTHER): Payer: Medicare HMO | Admitting: Oncology

## 2010-08-28 DIAGNOSIS — C187 Malignant neoplasm of sigmoid colon: Secondary | ICD-10-CM

## 2010-08-28 DIAGNOSIS — D509 Iron deficiency anemia, unspecified: Secondary | ICD-10-CM

## 2010-08-28 LAB — COMPREHENSIVE METABOLIC PANEL
ALT: 22 U/L (ref 0–53)
Alkaline Phosphatase: 133 U/L — ABNORMAL HIGH (ref 39–117)
Creatinine, Ser: 0.79 mg/dL (ref 0.40–1.50)
Sodium: 140 mEq/L (ref 135–145)
Total Bilirubin: 1.6 mg/dL — ABNORMAL HIGH (ref 0.3–1.2)
Total Protein: 6.9 g/dL (ref 6.0–8.3)

## 2010-08-28 LAB — CBC WITH DIFFERENTIAL/PLATELET
BASO%: 0.2 % (ref 0.0–2.0)
LYMPH%: 25.1 % (ref 14.0–49.0)
MCH: 25.9 pg — ABNORMAL LOW (ref 27.2–33.4)
MCHC: 32.9 g/dL (ref 32.0–36.0)
MCV: 78.8 fL — ABNORMAL LOW (ref 79.3–98.0)
MONO%: 10.4 % (ref 0.0–14.0)
NEUT%: 59.4 % (ref 39.0–75.0)
Platelets: 188 10*3/uL (ref 140–400)
RBC: 5.26 10*6/uL (ref 4.20–5.82)

## 2010-09-17 LAB — CBC
HCT: 30.6 % — ABNORMAL LOW (ref 39.0–52.0)
HCT: 32.6 % — ABNORMAL LOW (ref 39.0–52.0)
Hemoglobin: 10.3 g/dL — ABNORMAL LOW (ref 13.0–17.0)
Hemoglobin: 9.7 g/dL — ABNORMAL LOW (ref 13.0–17.0)
MCH: 20.8 pg — ABNORMAL LOW (ref 26.0–34.0)
MCH: 20.8 pg — ABNORMAL LOW (ref 26.0–34.0)
MCHC: 31.7 g/dL (ref 30.0–36.0)
MCHC: 31.7 g/dL (ref 30.0–36.0)
MCV: 65.5 fL — ABNORMAL LOW (ref 78.0–100.0)
MCV: 65.5 fL — ABNORMAL LOW (ref 78.0–100.0)
Platelets: 195 10*3/uL (ref 150–400)
Platelets: 228 10*3/uL (ref 150–400)
RBC: 4.66 MIL/uL (ref 4.22–5.81)
RBC: 4.97 MIL/uL (ref 4.22–5.81)
RDW: 23.4 % — ABNORMAL HIGH (ref 11.5–15.5)
RDW: 24.8 % — ABNORMAL HIGH (ref 11.5–15.5)
WBC: 5.1 10*3/uL (ref 4.0–10.5)
WBC: 7.4 10*3/uL (ref 4.0–10.5)

## 2010-09-17 LAB — URINALYSIS, ROUTINE W REFLEX MICROSCOPIC
Ketones, ur: NEGATIVE mg/dL
Nitrite: NEGATIVE
Protein, ur: NEGATIVE mg/dL
pH: 6 (ref 5.0–8.0)

## 2010-09-17 LAB — DIFFERENTIAL
Basophils Absolute: 0 10*3/uL (ref 0.0–0.1)
Basophils Relative: 0 % (ref 0–1)
Eosinophils Absolute: 0.1 10*3/uL (ref 0.0–0.7)
Eosinophils Relative: 2 % (ref 0–5)
Lymphocytes Relative: 23 % (ref 12–46)
Lymphs Abs: 1.2 10*3/uL (ref 0.7–4.0)
Monocytes Absolute: 0.5 10*3/uL (ref 0.1–1.0)
Monocytes Relative: 9 % (ref 3–12)
Neutro Abs: 3.3 10*3/uL (ref 1.7–7.7)
Neutrophils Relative %: 66 % (ref 43–77)

## 2010-09-17 LAB — COMPREHENSIVE METABOLIC PANEL WITH GFR
ALT: 26 U/L (ref 0–53)
AST: 22 U/L (ref 0–37)
Albumin: 3.4 g/dL — ABNORMAL LOW (ref 3.5–5.2)
Alkaline Phosphatase: 94 U/L (ref 39–117)
BUN: 12 mg/dL (ref 6–23)
CO2: 29 meq/L (ref 19–32)
Calcium: 9.2 mg/dL (ref 8.4–10.5)
Chloride: 107 meq/L (ref 96–112)
Creatinine, Ser: 0.7 mg/dL (ref 0.4–1.5)
GFR calc non Af Amer: >60 mL/min
Glucose, Bld: 100 mg/dL — ABNORMAL HIGH (ref 70–99)
Potassium: 4 meq/L (ref 3.5–5.1)
Sodium: 143 meq/L (ref 135–145)
Total Bilirubin: 0.5 mg/dL (ref 0.3–1.2)
Total Protein: 6.9 g/dL (ref 6.0–8.3)

## 2010-09-17 LAB — TYPE AND SCREEN: Antibody Screen: NEGATIVE

## 2010-09-17 LAB — SURGICAL PCR SCREEN: MRSA, PCR: NEGATIVE

## 2010-09-17 LAB — ABO/RH: ABO/RH(D): B POS

## 2010-09-19 ENCOUNTER — Other Ambulatory Visit: Payer: Self-pay | Admitting: Oncology

## 2010-09-19 ENCOUNTER — Encounter (HOSPITAL_BASED_OUTPATIENT_CLINIC_OR_DEPARTMENT_OTHER): Payer: Medicare HMO | Admitting: Oncology

## 2010-09-19 DIAGNOSIS — C187 Malignant neoplasm of sigmoid colon: Secondary | ICD-10-CM

## 2010-09-19 DIAGNOSIS — D509 Iron deficiency anemia, unspecified: Secondary | ICD-10-CM

## 2010-09-19 LAB — COMPREHENSIVE METABOLIC PANEL
ALT: 18 U/L (ref 0–53)
Albumin: 3.7 g/dL (ref 3.5–5.2)
Alkaline Phosphatase: 70 U/L (ref 39–117)
CO2: 25 mEq/L (ref 19–32)
Calcium: 9.1 mg/dL (ref 8.4–10.5)
Chloride: 104 mEq/L (ref 96–112)
Creatinine, Ser: 0.85 mg/dL (ref 0.40–1.50)
Glucose, Bld: 118 mg/dL — ABNORMAL HIGH (ref 70–99)
Glucose, Bld: 97 mg/dL (ref 70–99)
Potassium: 4.1 mEq/L (ref 3.5–5.1)
Sodium: 141 mEq/L (ref 135–145)
Sodium: 137 mEq/L (ref 135–145)
Total Bilirubin: 1.7 mg/dL — ABNORMAL HIGH (ref 0.3–1.2)
Total Protein: 6.3 g/dL (ref 6.0–8.3)
Total Protein: 6.6 g/dL (ref 6.0–8.3)

## 2010-09-19 LAB — CBC
HCT: 30.5 % — ABNORMAL LOW (ref 39.0–52.0)
Platelets: 206 10*3/uL (ref 150–400)
RDW: 19.3 % — ABNORMAL HIGH (ref 11.5–15.5)
WBC: 7.9 10*3/uL (ref 4.0–10.5)

## 2010-09-19 LAB — DIFFERENTIAL
Basophils Relative: 1 % (ref 0–1)
Eosinophils Absolute: 0 10*3/uL (ref 0.0–0.7)
Lymphs Abs: 0.6 10*3/uL — ABNORMAL LOW (ref 0.7–4.0)
Monocytes Absolute: 0.2 10*3/uL (ref 0.1–1.0)
Neutro Abs: 7 10*3/uL (ref 1.7–7.7)

## 2010-09-19 LAB — CBC WITH DIFFERENTIAL/PLATELET
Eosinophils Absolute: 0.2 10*3/uL (ref 0.0–0.5)
HCT: 40.6 % (ref 38.4–49.9)
LYMPH%: 25.1 % (ref 14.0–49.0)
MONO#: 0.5 10*3/uL (ref 0.1–0.9)
NEUT#: 2.5 10*3/uL (ref 1.5–6.5)
NEUT%: 59.2 % (ref 39.0–75.0)
Platelets: 162 10*3/uL (ref 140–400)
WBC: 4.2 10*3/uL (ref 4.0–10.3)

## 2010-10-06 DIAGNOSIS — I2699 Other pulmonary embolism without acute cor pulmonale: Secondary | ICD-10-CM

## 2010-10-06 HISTORY — DX: Other pulmonary embolism without acute cor pulmonale: I26.99

## 2010-10-08 ENCOUNTER — Other Ambulatory Visit: Payer: Self-pay | Admitting: Oncology

## 2010-10-08 ENCOUNTER — Encounter (HOSPITAL_BASED_OUTPATIENT_CLINIC_OR_DEPARTMENT_OTHER): Payer: Medicare HMO | Admitting: Oncology

## 2010-10-08 DIAGNOSIS — D509 Iron deficiency anemia, unspecified: Secondary | ICD-10-CM

## 2010-10-08 DIAGNOSIS — C187 Malignant neoplasm of sigmoid colon: Secondary | ICD-10-CM

## 2010-10-08 LAB — COMPREHENSIVE METABOLIC PANEL
ALT: 30 U/L (ref 0–53)
Albumin: 4.3 g/dL (ref 3.5–5.2)
CO2: 25 mEq/L (ref 19–32)
Calcium: 8.7 mg/dL (ref 8.4–10.5)
Chloride: 105 mEq/L (ref 96–112)
Glucose, Bld: 91 mg/dL (ref 70–99)
Potassium: 4.2 mEq/L (ref 3.5–5.3)
Sodium: 140 mEq/L (ref 135–145)
Total Protein: 6.6 g/dL (ref 6.0–8.3)

## 2010-10-08 LAB — CBC WITH DIFFERENTIAL/PLATELET
BASO%: 0.3 % (ref 0.0–2.0)
Eosinophils Absolute: 0.2 10*3/uL (ref 0.0–0.5)
LYMPH%: 21.1 % (ref 14.0–49.0)
MONO#: 0.5 10*3/uL (ref 0.1–0.9)
NEUT#: 3.7 10*3/uL (ref 1.5–6.5)
Platelets: 154 10*3/uL (ref 140–400)
RBC: 4.6 10*6/uL (ref 4.20–5.82)
RDW: 29.2 % — ABNORMAL HIGH (ref 11.0–14.6)
WBC: 5.6 10*3/uL (ref 4.0–10.3)
lymph#: 1.2 10*3/uL (ref 0.9–3.3)

## 2010-10-28 ENCOUNTER — Ambulatory Visit (HOSPITAL_COMMUNITY)
Admission: RE | Admit: 2010-10-28 | Discharge: 2010-10-28 | Disposition: A | Discharge status: Home / Self Care | Payer: 59 | Source: Ambulatory Visit | Attending: Oncology | Admitting: Oncology

## 2010-10-28 ENCOUNTER — Encounter (HOSPITAL_BASED_OUTPATIENT_CLINIC_OR_DEPARTMENT_OTHER): Payer: 59 | Admitting: Oncology

## 2010-10-28 ENCOUNTER — Other Ambulatory Visit: Payer: Self-pay | Admitting: Oncology

## 2010-10-28 ENCOUNTER — Encounter (HOSPITAL_COMMUNITY)
Admission: RE | Admit: 2010-10-28 | Discharge: 2010-10-28 | Disposition: A | Discharge status: Home / Self Care | Payer: 59 | Source: Ambulatory Visit | Attending: Oncology | Admitting: Oncology

## 2010-10-28 ENCOUNTER — Ambulatory Visit
Admission: RE | Admit: 2010-10-28 | Discharge: 2010-10-28 | Disposition: A | Discharge status: Home / Self Care | Payer: 59 | Source: Ambulatory Visit | Attending: Oncology | Admitting: Oncology

## 2010-10-28 DIAGNOSIS — R079 Chest pain, unspecified: Secondary | ICD-10-CM | POA: Insufficient documentation

## 2010-10-28 DIAGNOSIS — Z85038 Personal history of other malignant neoplasm of large intestine: Secondary | ICD-10-CM | POA: Insufficient documentation

## 2010-10-28 DIAGNOSIS — R0602 Shortness of breath: Secondary | ICD-10-CM | POA: Insufficient documentation

## 2010-10-28 DIAGNOSIS — R52 Pain, unspecified: Secondary | ICD-10-CM

## 2010-10-28 DIAGNOSIS — J90 Pleural effusion, not elsewhere classified: Secondary | ICD-10-CM | POA: Insufficient documentation

## 2010-10-28 DIAGNOSIS — I2699 Other pulmonary embolism without acute cor pulmonale: Secondary | ICD-10-CM

## 2010-10-28 DIAGNOSIS — D509 Iron deficiency anemia, unspecified: Secondary | ICD-10-CM

## 2010-10-28 DIAGNOSIS — J9819 Other pulmonary collapse: Secondary | ICD-10-CM | POA: Insufficient documentation

## 2010-10-28 DIAGNOSIS — C187 Malignant neoplasm of sigmoid colon: Secondary | ICD-10-CM

## 2010-10-28 LAB — COMPREHENSIVE METABOLIC PANEL
ALT: 22 U/L (ref 0–53)
AST: 23 U/L (ref 0–37)
CO2: 23 mEq/L (ref 19–32)
Calcium: 9.6 mg/dL (ref 8.4–10.5)
Chloride: 103 mEq/L (ref 96–112)
Creatinine, Ser: 0.81 mg/dL (ref 0.40–1.50)
Sodium: 136 mEq/L (ref 135–145)
Total Bilirubin: 1.6 mg/dL — ABNORMAL HIGH (ref 0.3–1.2)
Total Protein: 6.9 g/dL (ref 6.0–8.3)

## 2010-10-28 LAB — CBC WITH DIFFERENTIAL/PLATELET
BASO%: 0.2 % (ref 0.0–2.0)
Eosinophils Absolute: 0.2 10*3/uL (ref 0.0–0.5)
LYMPH%: 18.7 % (ref 14.0–49.0)
MCHC: 33.6 g/dL (ref 32.0–36.0)
MONO#: 0.6 10*3/uL (ref 0.1–0.9)
NEUT#: 5.9 10*3/uL (ref 1.5–6.5)
RBC: 4.9 10*6/uL (ref 4.20–5.82)
RDW: 20.1 % — ABNORMAL HIGH (ref 11.0–14.6)
WBC: 8.2 10*3/uL (ref 4.0–10.3)

## 2010-10-28 LAB — D-DIMER, QUANTITATIVE: D-Dimer, Quant: 1.93 ug/mL-FEU — ABNORMAL HIGH (ref 0.00–0.48)

## 2010-10-28 MED ORDER — IOHEXOL 300 MG/ML  SOLN
125.0000 mL | Freq: Once | INTRAMUSCULAR | Status: AC | PRN
  Administered 2010-10-28: 125 mL via INTRAVENOUS

## 2010-10-29 ENCOUNTER — Encounter (HOSPITAL_BASED_OUTPATIENT_CLINIC_OR_DEPARTMENT_OTHER): Payer: 59 | Admitting: Oncology

## 2010-10-29 ENCOUNTER — Ambulatory Visit (HOSPITAL_COMMUNITY)
Admission: RE | Admit: 2010-10-29 | Discharge: 2010-10-29 | Disposition: A | Discharge status: Home / Self Care | Payer: 59 | Source: Ambulatory Visit | Attending: Oncology | Admitting: Oncology

## 2010-10-29 DIAGNOSIS — I2699 Other pulmonary embolism without acute cor pulmonale: Secondary | ICD-10-CM

## 2010-10-29 DIAGNOSIS — C189 Malignant neoplasm of colon, unspecified: Secondary | ICD-10-CM | POA: Insufficient documentation

## 2010-10-29 DIAGNOSIS — M79609 Pain in unspecified limb: Secondary | ICD-10-CM | POA: Insufficient documentation

## 2010-10-29 DIAGNOSIS — Z86718 Personal history of other venous thrombosis and embolism: Secondary | ICD-10-CM | POA: Insufficient documentation

## 2010-11-18 ENCOUNTER — Other Ambulatory Visit: Payer: Self-pay | Admitting: Oncology

## 2010-11-18 ENCOUNTER — Encounter (HOSPITAL_BASED_OUTPATIENT_CLINIC_OR_DEPARTMENT_OTHER): Payer: Self-pay | Admitting: Oncology

## 2010-11-18 DIAGNOSIS — Z7901 Long term (current) use of anticoagulants: Secondary | ICD-10-CM

## 2010-11-18 DIAGNOSIS — C187 Malignant neoplasm of sigmoid colon: Secondary | ICD-10-CM

## 2010-11-18 DIAGNOSIS — I2699 Other pulmonary embolism without acute cor pulmonale: Secondary | ICD-10-CM

## 2010-11-18 DIAGNOSIS — D509 Iron deficiency anemia, unspecified: Secondary | ICD-10-CM

## 2010-11-18 DIAGNOSIS — L270 Generalized skin eruption due to drugs and medicaments taken internally: Secondary | ICD-10-CM

## 2010-11-18 LAB — CBC WITH DIFFERENTIAL/PLATELET
BASO%: 0.2 % (ref 0.0–2.0)
EOS%: 3.8 % (ref 0.0–7.0)
HCT: 40.6 % (ref 38.4–49.9)
LYMPH%: 27.2 % (ref 14.0–49.0)
MCH: 31.7 pg (ref 27.2–33.4)
MCHC: 34.1 g/dL (ref 32.0–36.0)
NEUT%: 61.1 % (ref 39.0–75.0)
Platelets: 157 10*3/uL (ref 140–400)
RBC: 4.37 10*6/uL (ref 4.20–5.82)
WBC: 4.7 10*3/uL (ref 4.0–10.3)

## 2010-11-18 LAB — COMPREHENSIVE METABOLIC PANEL
ALT: 47 U/L (ref 0–53)
AST: 33 U/L (ref 0–37)
Alkaline Phosphatase: 112 U/L (ref 39–117)
Creatinine, Ser: 0.83 mg/dL (ref 0.40–1.50)
Sodium: 140 mEq/L (ref 135–145)
Total Bilirubin: 0.9 mg/dL (ref 0.3–1.2)
Total Protein: 6.5 g/dL (ref 6.0–8.3)

## 2010-11-22 NOTE — Op Note (Signed)
NAMECLAYBORNE, DIVIS              ACCOUNT NO.:  [id number]   MEDICAL RECORD NO.:  [id number]          PATIENT TYPE:  AMB   LOCATION:  [facility]                         FACILITY:  [facility]   PHYSICIAN:  Bertram Millard. Dahlstedt, M.D.DATE OF BIRTH:  [birth year]   DATE OF PROCEDURE:  06/14/2004  DATE OF DISCHARGE:                                 OPERATIVE REPORT   PREOPERATIVE DIAGNOSIS:  Desire for sterility.  (V25.09)   POSTOPERATIVE DIAGNOSIS:  Desire for sterility.  (V25.09)   PROCEDURE:  Bilateral vasectomy.   SURGEON:  Bertram Millard. Dahlstedt, M.D.   ANESTHESIA:  General with LMA.   COMPLICATIONS:  None.   BRIEF HISTORY:  A nice 45 year old male who recently presented for  vasectomy.  He and his wife desire permanent sterility.   He had a thickened left spermatic cord and had a quite tight scrotum in the  office.  Because of this, I recommended that he have an anesthetic procedure  for comfort purposes.  It would be quite difficult to do this without a  general anesthetic.  Patient understands the risks and complications of the  procedure and desires to proceed.   DESCRIPTION OF PROCEDURE:  Patient was administered general anesthetic using  LMA.  His scrotum and perineum were prepped and draped.  The right vasectomy  was transfixed underneath the scrotal skin with a small clamp.  Then a  scalpel hemostat was used to puncture the skin and spread the puncture  wound.  The vas was then carefully dissected superiorly inferiorly.  The  cord was blocked with approximately 1.5 cc of 0.25% plain Marcaine on that  right side.  An approximately 1.5 cm length of vas deferens on the right was  excised.  Then 2-0 Prolene was used to ligate the free ends.  Then 3-0  chromic was used to bury the ends in Dartos fascia, such that it would be  hard to reapproximate.  The right cord contents were then dropped back into  the scrotum.  Through that same puncture wound, the left spermatic cord was  eventually isolated.  The cord was somewhat thickened, and it was difficult  to find this.  The vas deferens was then treated the same way as on the  right with division, excision, ligature, and bearing in Dartos fascia.  The  contents were then released into the scrotum.  The small scrotal wound was  pinched shut.  Kerlix and a jock strap were placed.   The patient tolerated the procedure well.  He was awakened, extubated, taken  to the PACU in stable condition.     Step   SMD/MEDQ  D:  06/14/2004  T:  06/14/2004  Job:  [id number]

## 2010-12-09 ENCOUNTER — Telehealth: Payer: Self-pay | Admitting: Internal Medicine

## 2010-12-09 ENCOUNTER — Other Ambulatory Visit: Payer: Self-pay | Admitting: Oncology

## 2010-12-09 ENCOUNTER — Encounter (HOSPITAL_BASED_OUTPATIENT_CLINIC_OR_DEPARTMENT_OTHER): Payer: Self-pay | Admitting: Oncology

## 2010-12-09 DIAGNOSIS — D509 Iron deficiency anemia, unspecified: Secondary | ICD-10-CM

## 2010-12-09 DIAGNOSIS — I2699 Other pulmonary embolism without acute cor pulmonale: Secondary | ICD-10-CM

## 2010-12-09 DIAGNOSIS — Z7901 Long term (current) use of anticoagulants: Secondary | ICD-10-CM

## 2010-12-09 DIAGNOSIS — C187 Malignant neoplasm of sigmoid colon: Secondary | ICD-10-CM

## 2010-12-09 LAB — CBC WITH DIFFERENTIAL/PLATELET
BASO%: 0.2 % (ref 0.0–2.0)
HCT: 41.2 % (ref 38.4–49.9)
LYMPH%: 27.6 % (ref 14.0–49.0)
MCH: 31.9 pg (ref 27.2–33.4)
MCHC: 34.3 g/dL (ref 32.0–36.0)
MCV: 92.9 fL (ref 79.3–98.0)
MONO#: 0.5 10*3/uL (ref 0.1–0.9)
MONO%: 9.6 % (ref 0.0–14.0)
NEUT%: 58.7 % (ref 39.0–75.0)
Platelets: 145 10*3/uL (ref 140–400)
RBC: 4.43 10*6/uL (ref 4.20–5.82)
WBC: 4.8 10*3/uL (ref 4.0–10.3)

## 2010-12-09 NOTE — Telephone Encounter (Signed)
Dr. Truett Perna wants the pt to have a repeat colon, pt had one in October but it was incomplete due to obstruction. Does pt need an OV or can he be scheduled as a direct colon? Dr. Marina Goodell please advise.

## 2010-12-09 NOTE — Telephone Encounter (Signed)
Direct colonoscopy. Thanks

## 2010-12-09 NOTE — Telephone Encounter (Signed)
Pt scheduled for previsit for 12/11/10@2 :30pm and colon for 12/17/10@9am . Crystal to notify pt of appt dates and times.

## 2010-12-11 ENCOUNTER — Telehealth: Payer: Self-pay | Admitting: *Deleted

## 2010-12-11 NOTE — Telephone Encounter (Signed)
Pt did not come for PV today.  Talked with patient.  He says that he will call to reschedule colonoscopy and PV after he talks with his wife. Ezra Sites

## 2010-12-12 LAB — HYPERCOAGULABLE PANEL, COMPREHENSIVE
Anticardiolipin IgA: 2 APL U/mL (ref <22)
Anticardiolipin IgM: 15 MPL U/mL — ABNORMAL HIGH (ref <11)
Beta-2-Glycoprotein I IgA: 5 A Units (ref <20)
Protein C, Total: 45 % — ABNORMAL LOW (ref 72–160)

## 2010-12-13 ENCOUNTER — Encounter (HOSPITAL_BASED_OUTPATIENT_CLINIC_OR_DEPARTMENT_OTHER): Payer: Self-pay | Admitting: Oncology

## 2010-12-13 ENCOUNTER — Other Ambulatory Visit: Payer: Self-pay | Admitting: Oncology

## 2010-12-13 DIAGNOSIS — Z7901 Long term (current) use of anticoagulants: Secondary | ICD-10-CM

## 2010-12-13 DIAGNOSIS — I2699 Other pulmonary embolism without acute cor pulmonale: Secondary | ICD-10-CM

## 2010-12-13 DIAGNOSIS — D509 Iron deficiency anemia, unspecified: Secondary | ICD-10-CM

## 2010-12-13 DIAGNOSIS — C187 Malignant neoplasm of sigmoid colon: Secondary | ICD-10-CM

## 2010-12-13 LAB — PROTIME-INR: INR: 3.3 (ref 2.00–3.50)

## 2010-12-17 ENCOUNTER — Other Ambulatory Visit: Payer: Self-pay | Admitting: Internal Medicine

## 2010-12-20 ENCOUNTER — Other Ambulatory Visit: Payer: Self-pay | Admitting: Oncology

## 2010-12-20 ENCOUNTER — Encounter: Payer: Self-pay | Admitting: Oncology

## 2010-12-20 LAB — PROTIME-INR

## 2010-12-30 ENCOUNTER — Other Ambulatory Visit: Payer: Self-pay | Admitting: Oncology

## 2010-12-30 ENCOUNTER — Encounter (HOSPITAL_BASED_OUTPATIENT_CLINIC_OR_DEPARTMENT_OTHER): Payer: 59 | Admitting: Oncology

## 2010-12-30 DIAGNOSIS — Z7901 Long term (current) use of anticoagulants: Secondary | ICD-10-CM

## 2010-12-30 DIAGNOSIS — I2699 Other pulmonary embolism without acute cor pulmonale: Secondary | ICD-10-CM

## 2010-12-30 LAB — PROTIME-INR: INR: 2.9 (ref 2.00–3.50)

## 2011-01-03 ENCOUNTER — Other Ambulatory Visit: Payer: Self-pay | Admitting: Oncology

## 2011-01-03 ENCOUNTER — Encounter (HOSPITAL_BASED_OUTPATIENT_CLINIC_OR_DEPARTMENT_OTHER): Payer: 59 | Admitting: Oncology

## 2011-01-03 DIAGNOSIS — I2699 Other pulmonary embolism without acute cor pulmonale: Secondary | ICD-10-CM

## 2011-01-03 DIAGNOSIS — Z7901 Long term (current) use of anticoagulants: Secondary | ICD-10-CM

## 2011-01-03 LAB — PROTIME-INR: INR: 1.8 — ABNORMAL LOW (ref 2.00–3.50)

## 2011-01-13 ENCOUNTER — Encounter (HOSPITAL_BASED_OUTPATIENT_CLINIC_OR_DEPARTMENT_OTHER): Payer: 59 | Admitting: Oncology

## 2011-01-13 ENCOUNTER — Other Ambulatory Visit: Payer: Self-pay | Admitting: Oncology

## 2011-01-13 DIAGNOSIS — Z7901 Long term (current) use of anticoagulants: Secondary | ICD-10-CM

## 2011-01-13 DIAGNOSIS — C187 Malignant neoplasm of sigmoid colon: Secondary | ICD-10-CM

## 2011-01-13 DIAGNOSIS — I2699 Other pulmonary embolism without acute cor pulmonale: Secondary | ICD-10-CM

## 2011-01-13 DIAGNOSIS — D509 Iron deficiency anemia, unspecified: Secondary | ICD-10-CM

## 2011-01-13 LAB — PROTIME-INR: Protime: 21.6 Seconds — ABNORMAL HIGH (ref 10.6–13.4)

## 2011-01-27 ENCOUNTER — Other Ambulatory Visit: Payer: Self-pay | Admitting: Oncology

## 2011-01-27 ENCOUNTER — Encounter (HOSPITAL_BASED_OUTPATIENT_CLINIC_OR_DEPARTMENT_OTHER): Payer: 59 | Admitting: Oncology

## 2011-01-27 DIAGNOSIS — I2699 Other pulmonary embolism without acute cor pulmonale: Secondary | ICD-10-CM

## 2011-01-27 DIAGNOSIS — Z7901 Long term (current) use of anticoagulants: Secondary | ICD-10-CM

## 2011-01-27 DIAGNOSIS — C187 Malignant neoplasm of sigmoid colon: Secondary | ICD-10-CM

## 2011-01-27 DIAGNOSIS — D509 Iron deficiency anemia, unspecified: Secondary | ICD-10-CM

## 2011-01-27 LAB — PROTIME-INR

## 2011-02-03 IMAGING — CT CT ABD-PELV W/ CM
1 series · 15 of 32 positions shown, 19 images · IV contrast (Omnipaque 300)
Comparison: None

CLINICAL DATA: Newly diagnosed sigmoid colon mass on colonoscopy.
Staging.

CT ABDOMEN AND PELVIS WITH CONTRAST
TECHNIQUE: Multidetector CT imaging of the abdomen and pelvis was
performed following the standard protocol during bolus
administration of intravenous contrast.
Contrast: 100 ml intravenous Wmnipaque-2PP

[Series 603: sag liver · sagittal · 0.69mm/px · 15 of 153 slices shown, 19 images]
[im 5/153  lung]
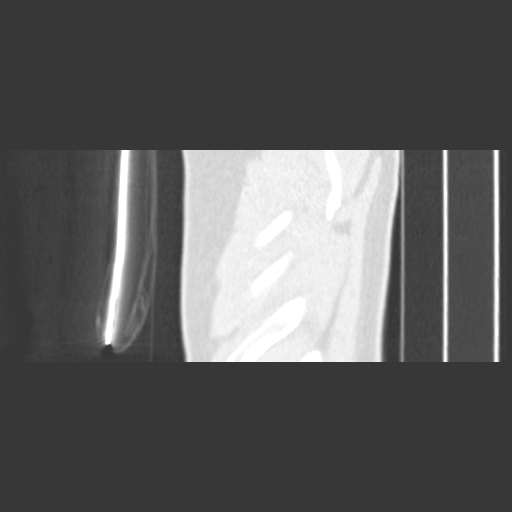
[im 10/153  soft-tissue]
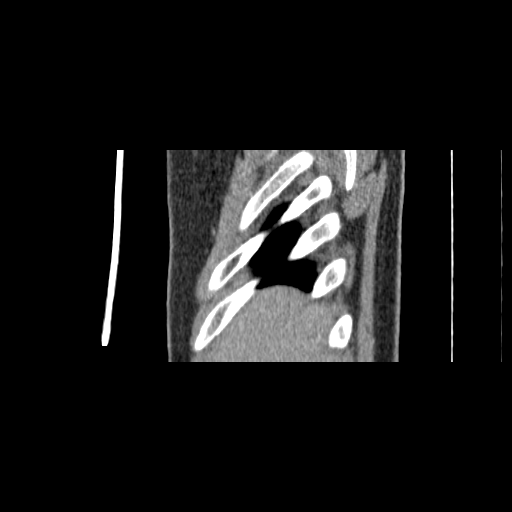
[im 10/153  lung]
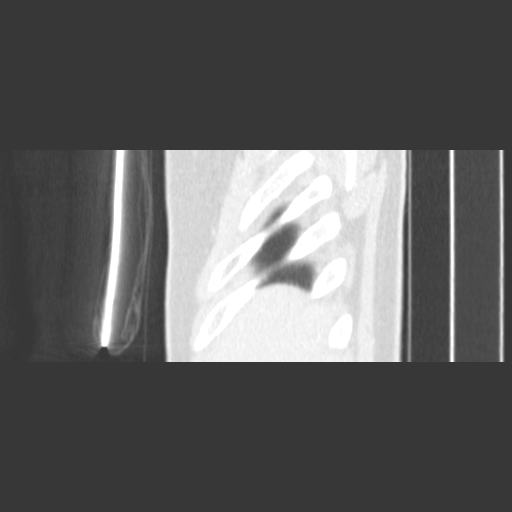
[im 10/153  bone]
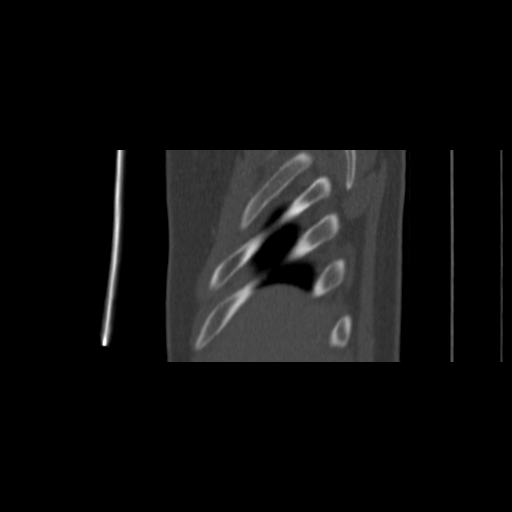
[im 15/153  lung]
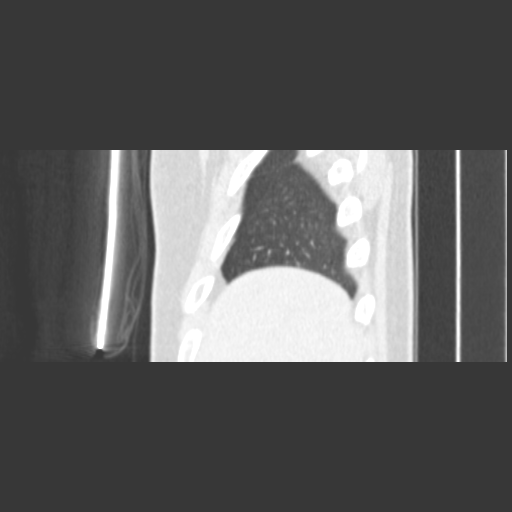
[im 20/153  soft-tissue]
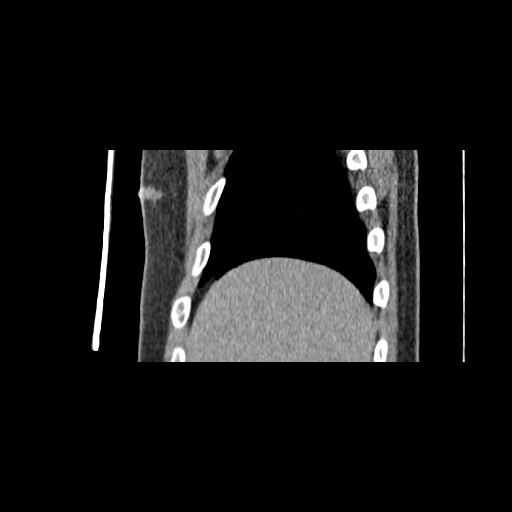
[im 20/153  lung]
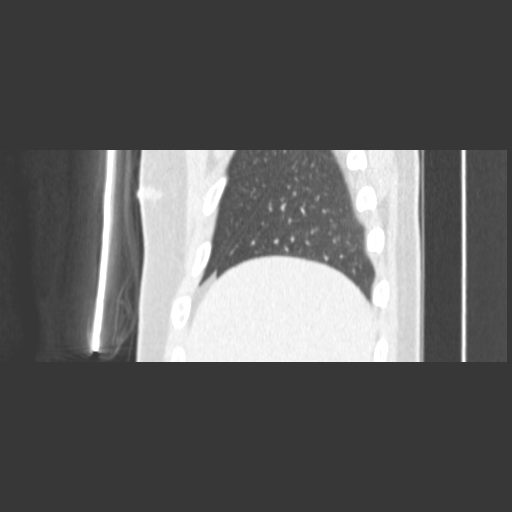
[im 30/153  soft-tissue]
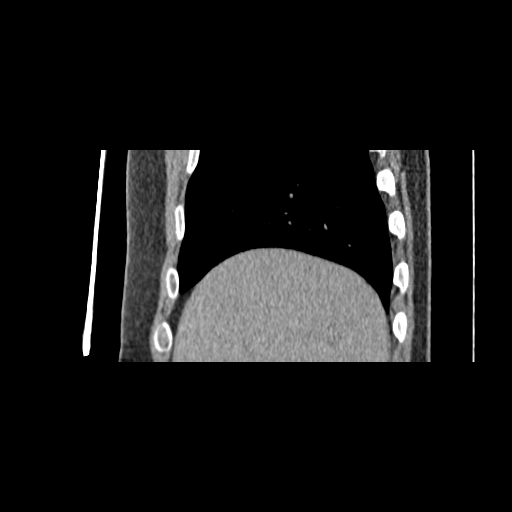
[im 45/153  soft-tissue]
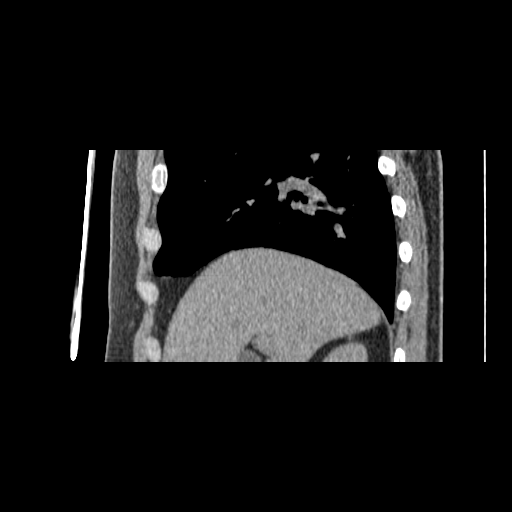
[im 54/153  soft-tissue]
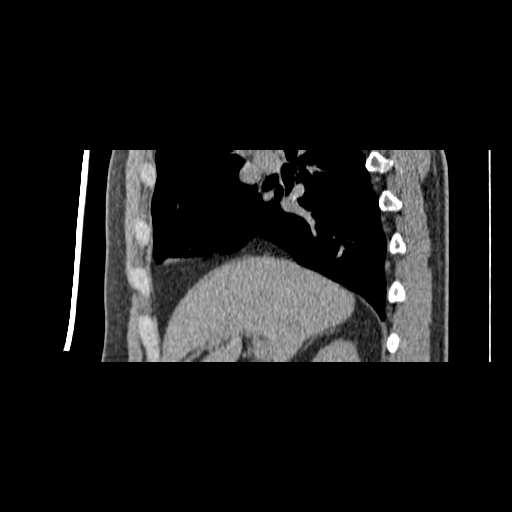
[im 64/153  soft-tissue]
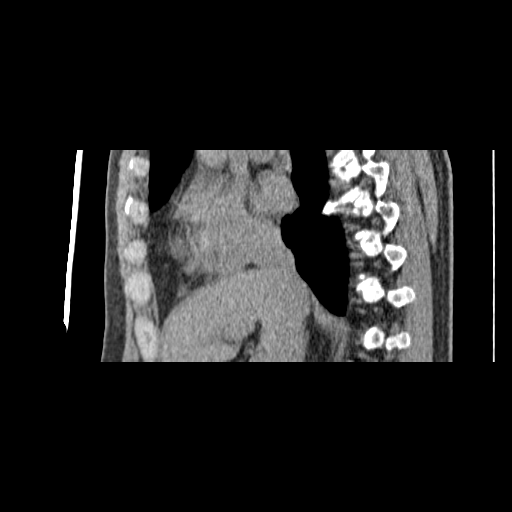
[im 79/153  soft-tissue]
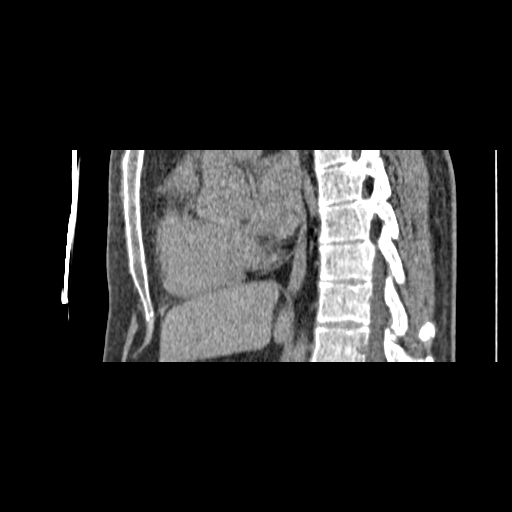
[im 89/153  soft-tissue]
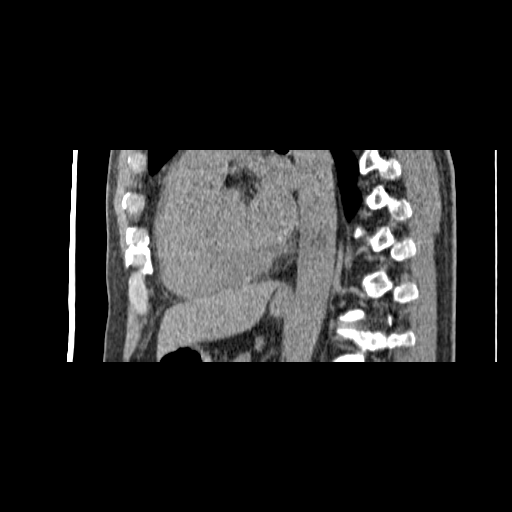
[im 99/153  soft-tissue]
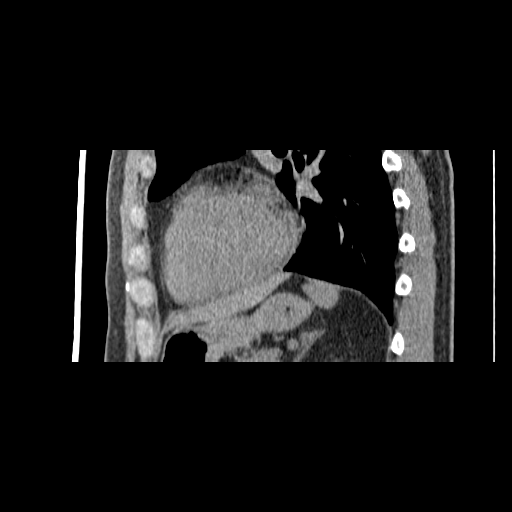
[im 99/153  bone]
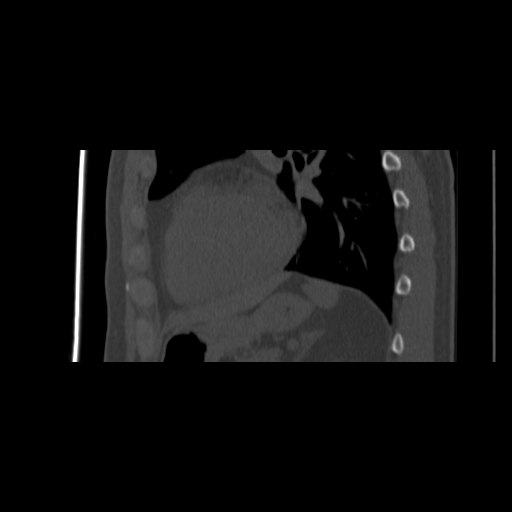
[im 108/153  soft-tissue]
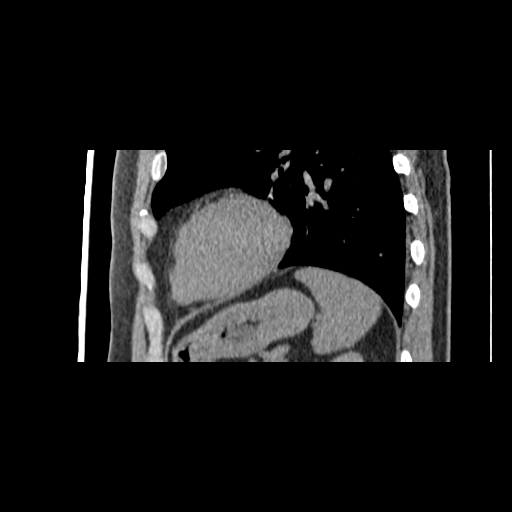
[im 123/153  soft-tissue]
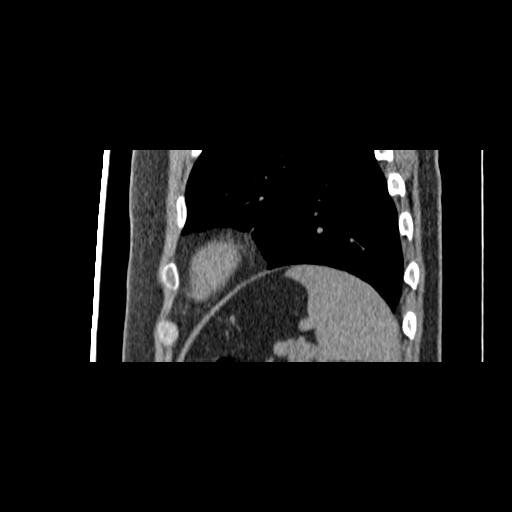
[im 133/153  soft-tissue]
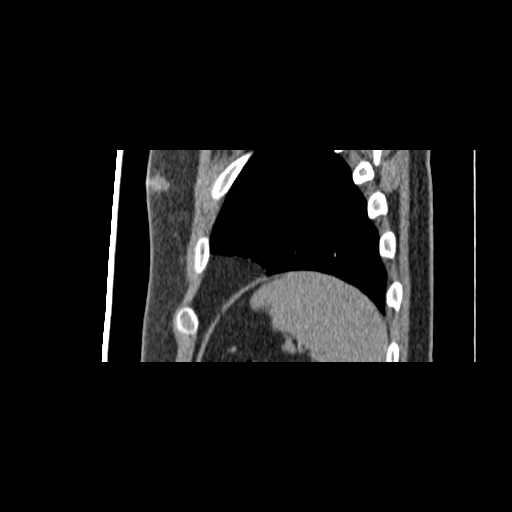
[im 143/153  soft-tissue]
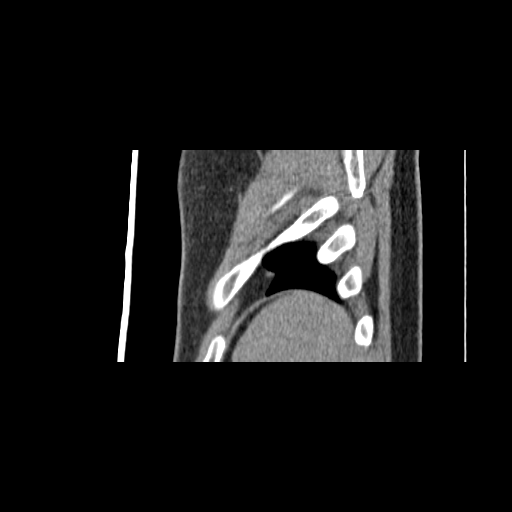

[15 of 32 positions shown; findings below may reference images not displayed]

FINDINGS: The liver, spleen, pancreas, adrenal glands, gallbladder
and kidneys are unremarkable.

Circumferential wall thickening is identified in the proximal
sigmoid colon spanning a length of 4 cm.  Pericolonic stranding and
mildly enlarged pericolonic lymph nodes are compatible with
neoplastic spread, if this mass is biopsy proven to be neoplasm.

No other enlarged lymph nodes, free fluid, biliary dilatation or
abdominal aortic aneurysm identified.

No other bowel abnormalities are identified.
The appendix is normal.
The bladder is unremarkable.

No acute or suspicious bony abnormalities are identified.
IMPRESSION: 4 cm long region of proximal sigmoid colonic  wall thickening
compatible with mass identified on recent colonoscopy.  Pericolonic
stranding and mildly enlarged adjacent lymph nodes are compatible
with neoplastic spread if this mass does represent tumor.

No evidence of distant metastatic disease.

## 2011-02-04 ENCOUNTER — Other Ambulatory Visit: Payer: Self-pay | Admitting: Oncology

## 2011-02-04 ENCOUNTER — Encounter (HOSPITAL_BASED_OUTPATIENT_CLINIC_OR_DEPARTMENT_OTHER): Payer: 59 | Admitting: Oncology

## 2011-02-04 DIAGNOSIS — D509 Iron deficiency anemia, unspecified: Secondary | ICD-10-CM

## 2011-02-04 DIAGNOSIS — Z7901 Long term (current) use of anticoagulants: Secondary | ICD-10-CM

## 2011-02-04 DIAGNOSIS — C187 Malignant neoplasm of sigmoid colon: Secondary | ICD-10-CM

## 2011-02-04 DIAGNOSIS — I2699 Other pulmonary embolism without acute cor pulmonale: Secondary | ICD-10-CM

## 2011-02-04 LAB — CEA: CEA: 2.4 ng/mL (ref 0.0–5.0)

## 2011-02-04 LAB — PROTIME-INR: Protime: 20.4 Seconds — ABNORMAL HIGH (ref 10.6–13.4)

## 2011-02-10 ENCOUNTER — Other Ambulatory Visit: Payer: Self-pay | Admitting: Oncology

## 2011-02-10 ENCOUNTER — Encounter (HOSPITAL_BASED_OUTPATIENT_CLINIC_OR_DEPARTMENT_OTHER): Payer: 59 | Admitting: Oncology

## 2011-02-10 DIAGNOSIS — Z7901 Long term (current) use of anticoagulants: Secondary | ICD-10-CM

## 2011-02-10 DIAGNOSIS — D509 Iron deficiency anemia, unspecified: Secondary | ICD-10-CM

## 2011-02-10 DIAGNOSIS — C187 Malignant neoplasm of sigmoid colon: Secondary | ICD-10-CM

## 2011-02-10 DIAGNOSIS — I2699 Other pulmonary embolism without acute cor pulmonale: Secondary | ICD-10-CM

## 2011-02-25 ENCOUNTER — Other Ambulatory Visit: Payer: Self-pay | Admitting: Oncology

## 2011-02-25 ENCOUNTER — Encounter (HOSPITAL_BASED_OUTPATIENT_CLINIC_OR_DEPARTMENT_OTHER): Payer: 59 | Admitting: Oncology

## 2011-02-25 DIAGNOSIS — D509 Iron deficiency anemia, unspecified: Secondary | ICD-10-CM

## 2011-02-25 DIAGNOSIS — Z7901 Long term (current) use of anticoagulants: Secondary | ICD-10-CM

## 2011-02-25 DIAGNOSIS — I2699 Other pulmonary embolism without acute cor pulmonale: Secondary | ICD-10-CM

## 2011-02-25 DIAGNOSIS — C187 Malignant neoplasm of sigmoid colon: Secondary | ICD-10-CM

## 2011-02-28 ENCOUNTER — Ambulatory Visit: Payer: 59 | Admitting: Family Medicine

## 2011-03-07 ENCOUNTER — Encounter (HOSPITAL_BASED_OUTPATIENT_CLINIC_OR_DEPARTMENT_OTHER): Payer: 59 | Admitting: Oncology

## 2011-03-07 ENCOUNTER — Other Ambulatory Visit: Payer: Self-pay | Admitting: Oncology

## 2011-03-07 DIAGNOSIS — I2699 Other pulmonary embolism without acute cor pulmonale: Secondary | ICD-10-CM

## 2011-03-07 DIAGNOSIS — C187 Malignant neoplasm of sigmoid colon: Secondary | ICD-10-CM

## 2011-03-07 DIAGNOSIS — Z7901 Long term (current) use of anticoagulants: Secondary | ICD-10-CM

## 2011-03-07 DIAGNOSIS — D509 Iron deficiency anemia, unspecified: Secondary | ICD-10-CM

## 2011-03-07 LAB — PROTIME-INR

## 2011-03-31 ENCOUNTER — Other Ambulatory Visit: Payer: Self-pay | Admitting: Oncology

## 2011-03-31 ENCOUNTER — Encounter (HOSPITAL_BASED_OUTPATIENT_CLINIC_OR_DEPARTMENT_OTHER): Payer: Managed Care, Other (non HMO) | Admitting: Oncology

## 2011-03-31 DIAGNOSIS — I2699 Other pulmonary embolism without acute cor pulmonale: Secondary | ICD-10-CM

## 2011-03-31 DIAGNOSIS — D509 Iron deficiency anemia, unspecified: Secondary | ICD-10-CM

## 2011-03-31 DIAGNOSIS — Z7901 Long term (current) use of anticoagulants: Secondary | ICD-10-CM

## 2011-03-31 DIAGNOSIS — C187 Malignant neoplasm of sigmoid colon: Secondary | ICD-10-CM

## 2011-03-31 LAB — PROTIME-INR: Protime: 28.8 Seconds — ABNORMAL HIGH (ref 10.6–13.4)

## 2011-04-14 ENCOUNTER — Other Ambulatory Visit: Payer: Self-pay | Admitting: Oncology

## 2011-04-14 ENCOUNTER — Encounter (HOSPITAL_BASED_OUTPATIENT_CLINIC_OR_DEPARTMENT_OTHER): Payer: Managed Care, Other (non HMO) | Admitting: Oncology

## 2011-04-14 LAB — PROTIME-INR: Protime: 31.2 Seconds — ABNORMAL HIGH (ref 10.6–13.4)

## 2011-04-14 IMAGING — CR DG CHEST 2V
2 series · 2 of 2 positions shown · non-contrast
Comparison: None.

CLINICAL DATA: Colon cancer - no current chest complaints

CHEST - 2 VIEW

[w chest pa]
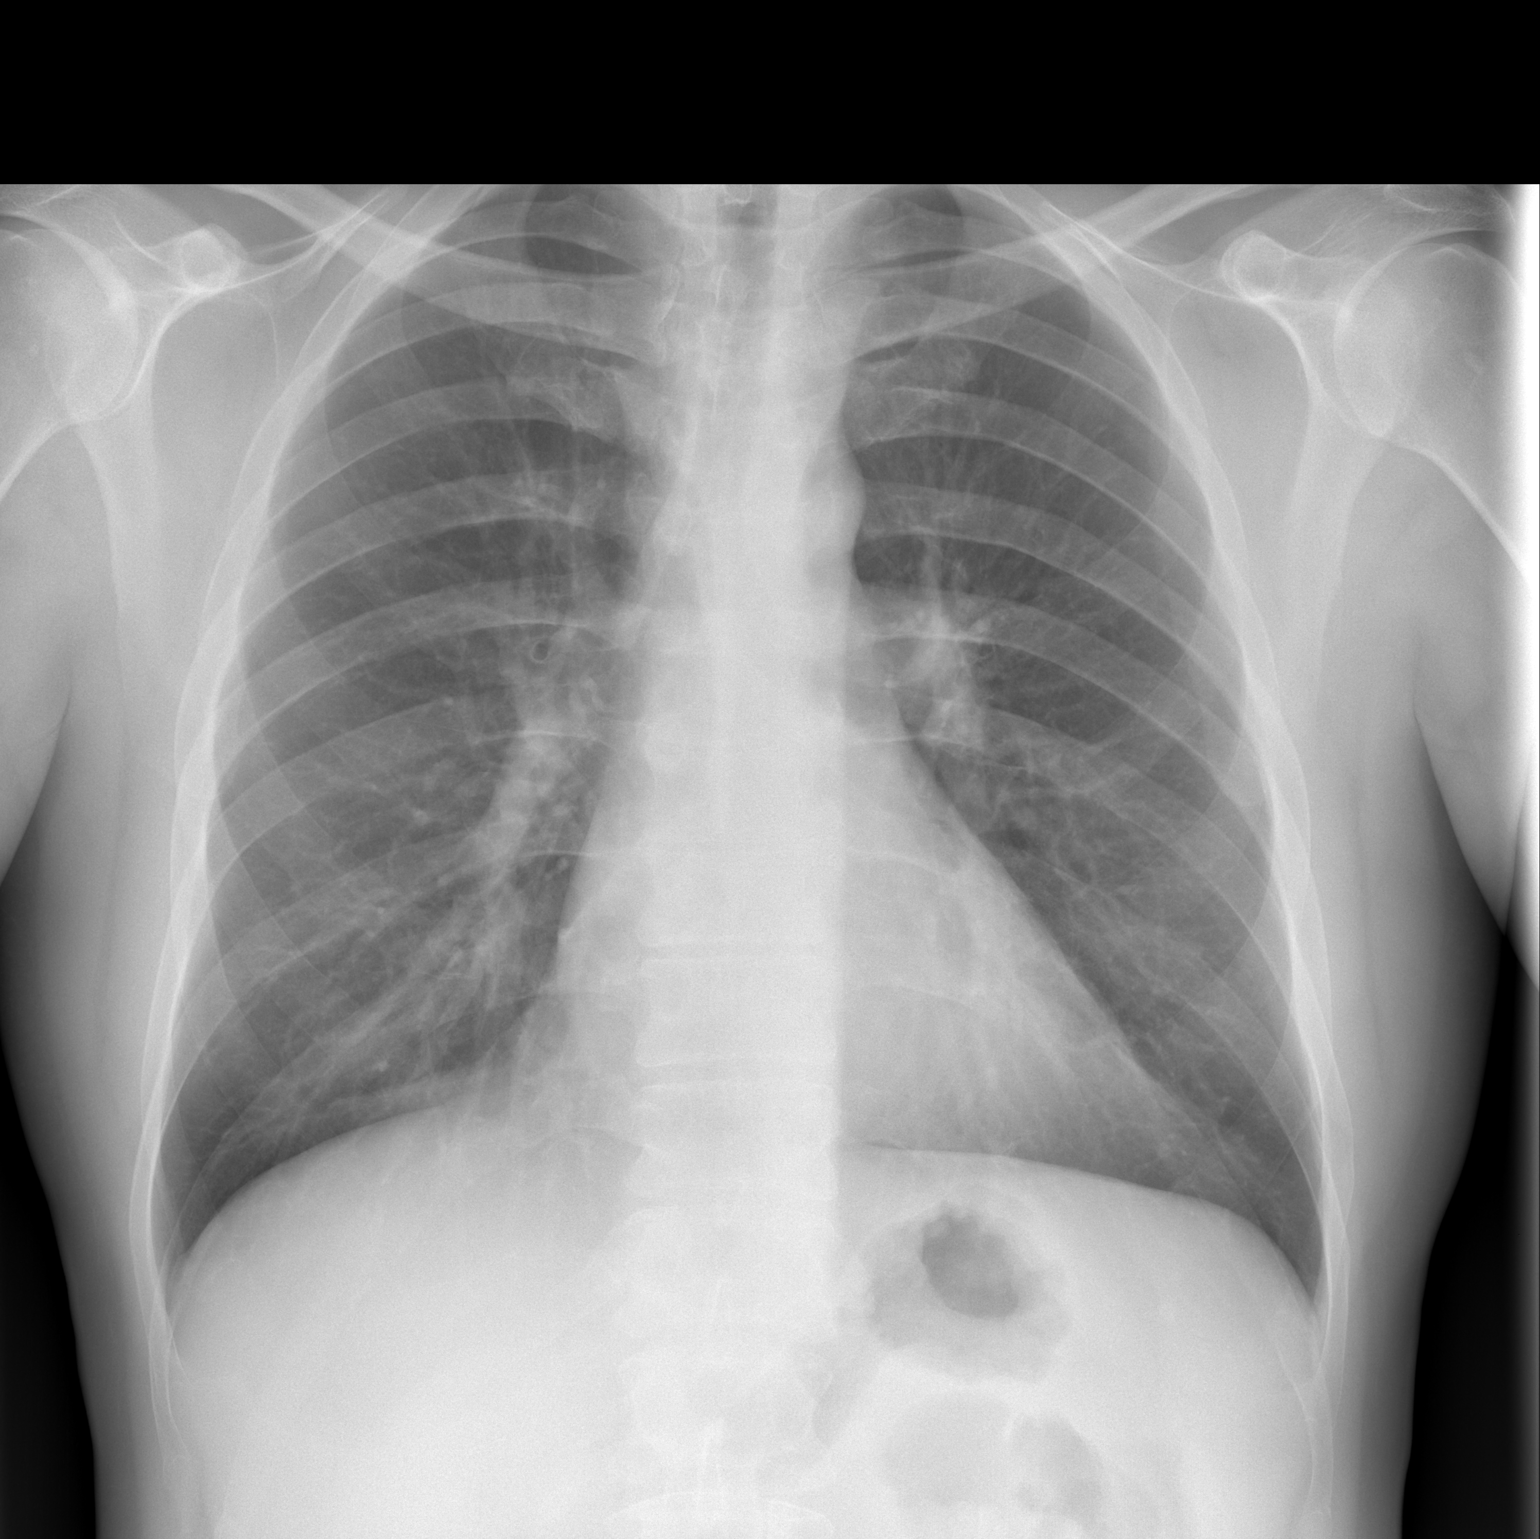

[w chest lat]
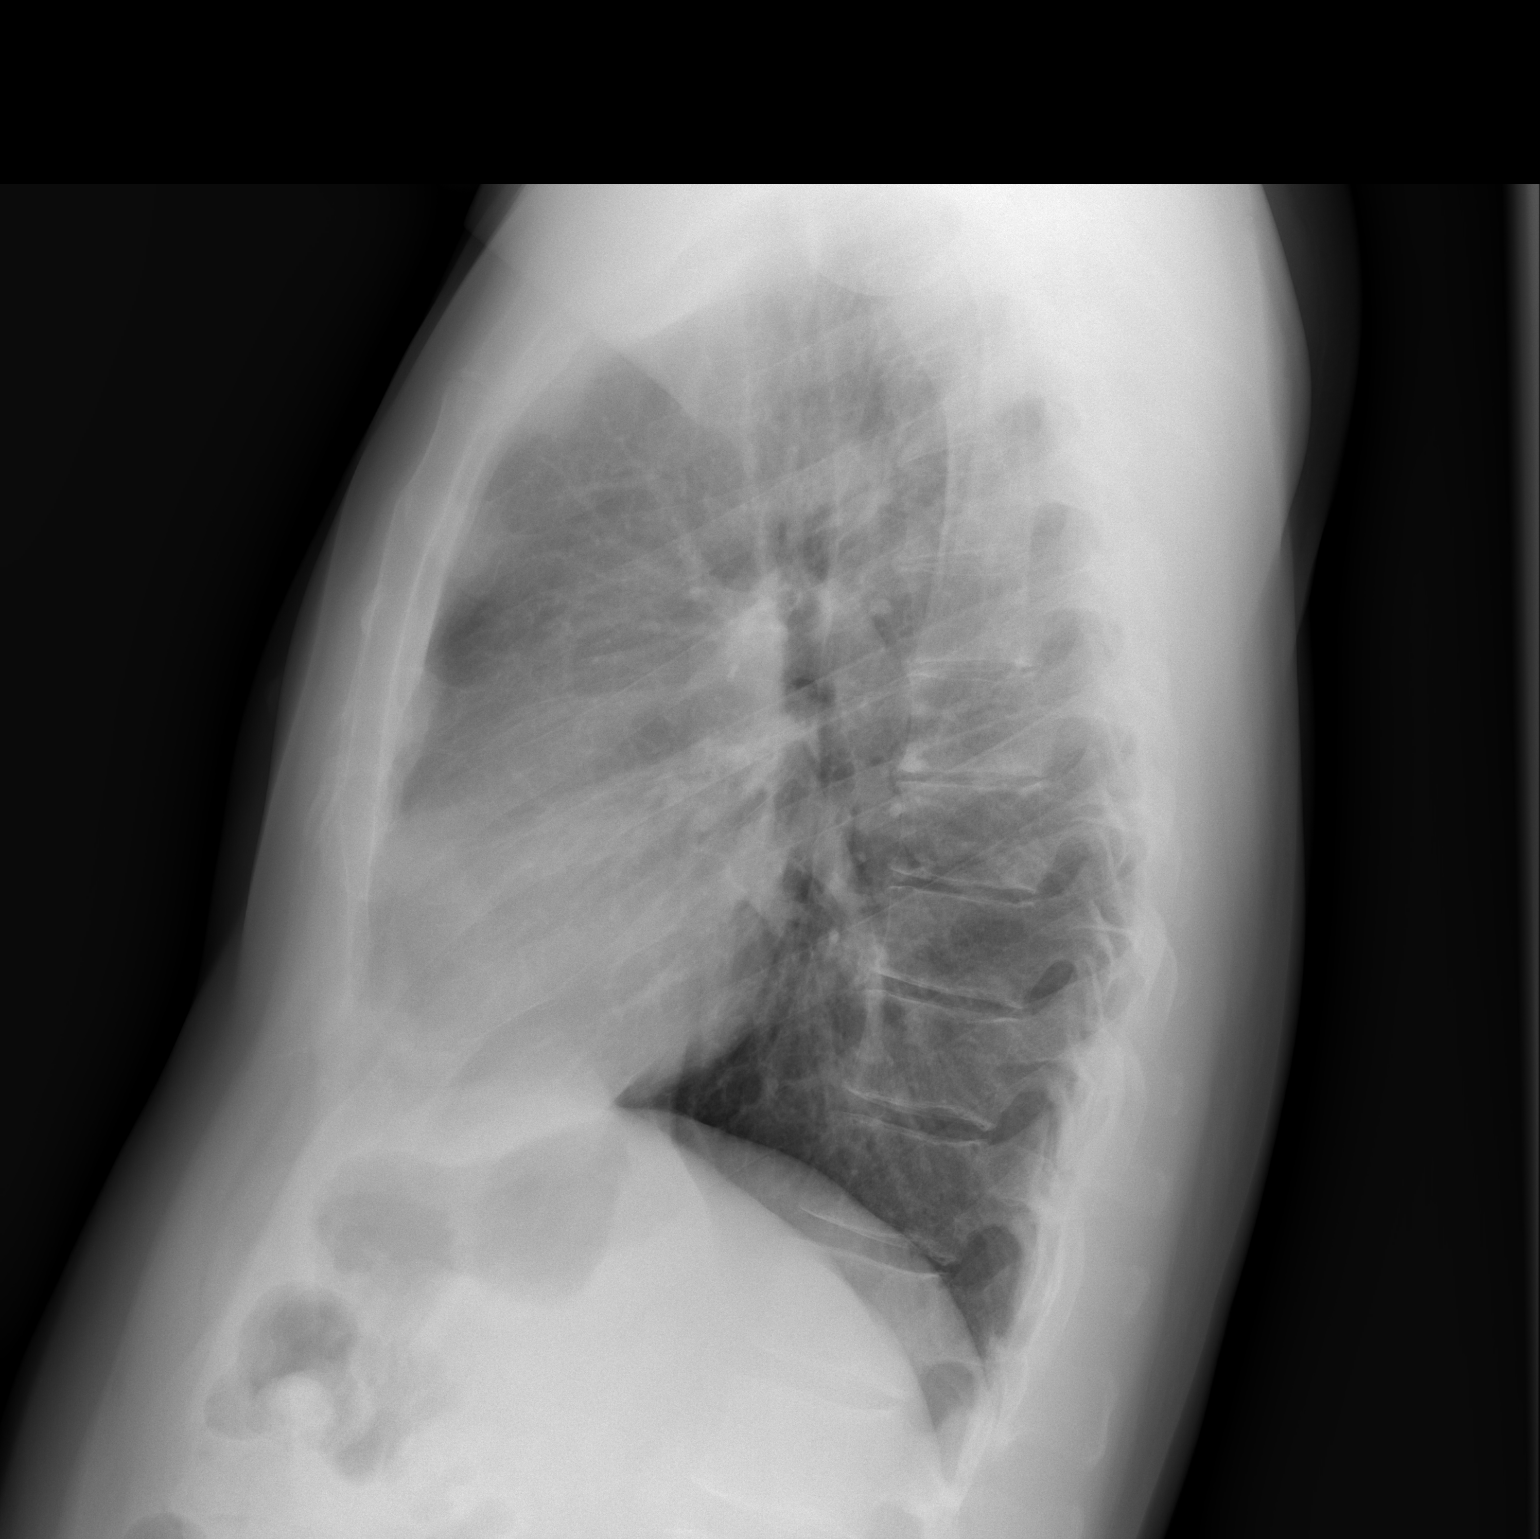

[2 of 2 positions shown; findings below may reference images not displayed]

FINDINGS: Heart and mediastinal contours normal.  Lungs clear.  No
pleural fluid.  Osseous structures and soft tissues unremarkable.
IMPRESSION: No acute or significant findings.

## 2011-05-08 DIAGNOSIS — I2699 Other pulmonary embolism without acute cor pulmonale: Secondary | ICD-10-CM | POA: Insufficient documentation

## 2011-05-13 LAB — HYPERCOAGULABLE PANEL, COMPREHENSIVE
AntiThromb III Func: 94 % (ref 76–126)
Anticardiolipin IgG: 4 GPL U/mL (ref <23)
Anticardiolipin IgM: 15 MPL U/mL — ABNORMAL HIGH (ref <11)
Beta-2 Glyco I IgG: 1 G Units (ref <20)
Beta-2-Glycoprotein I IgM: 51 M Units — ABNORMAL HIGH (ref <20)
DRVVT: 38.4 secs (ref 36.2–44.3)
PTT Lupus Anticoagulant: 31.7 secs (ref 30.0–45.6)
Protein S Total: 86 % (ref 60–150)

## 2011-05-14 ENCOUNTER — Other Ambulatory Visit: Payer: Managed Care, Other (non HMO) | Admitting: Lab

## 2011-05-14 ENCOUNTER — Other Ambulatory Visit: Payer: Self-pay | Admitting: *Deleted

## 2011-05-14 ENCOUNTER — Telehealth: Payer: Self-pay | Admitting: Oncology

## 2011-05-14 ENCOUNTER — Other Ambulatory Visit: Payer: Self-pay | Admitting: Oncology

## 2011-05-14 ENCOUNTER — Ambulatory Visit: Payer: Managed Care, Other (non HMO)

## 2011-05-14 ENCOUNTER — Telehealth: Payer: Self-pay | Admitting: *Deleted

## 2011-05-14 DIAGNOSIS — I2699 Other pulmonary embolism without acute cor pulmonale: Secondary | ICD-10-CM

## 2011-05-14 DIAGNOSIS — D509 Iron deficiency anemia, unspecified: Secondary | ICD-10-CM

## 2011-05-14 DIAGNOSIS — Z7901 Long term (current) use of anticoagulants: Secondary | ICD-10-CM

## 2011-05-14 DIAGNOSIS — C187 Malignant neoplasm of sigmoid colon: Secondary | ICD-10-CM

## 2011-05-14 LAB — POCT INR: INR: 1.9

## 2011-05-14 LAB — PROTIME-INR: INR: 1.9 — ABNORMAL LOW (ref 2.00–3.50)

## 2011-05-14 NOTE — Progress Notes (Signed)
Pt is to see Dr. Isaiah Serge at [facility] soon.  Dr. Truett Perna was sending him there to get advice about when to d/c his anticoagulation. Pt needs another colonoscopy for screening & plan was for pt to be off anticoagulation for this (per pt). Will have Dr. Kalman Drape RN f/u w/ pt about this.  Pt seeing Dr. Truett Perna 05/27/11. For now, have scheduled pt for next protime in Dec. & if he comes off anticoagulation, we will d/c him from our service. -G.Nashalie Sallis, Pharm.D.

## 2011-05-14 NOTE — Telephone Encounter (Signed)
Yes , ok to reschedule until after seen at [facility]

## 2011-05-14 NOTE — Telephone Encounter (Signed)
S/w the pt regarding his coumadin appt for 06/10/2011@8 :15am

## 2011-05-14 NOTE — Telephone Encounter (Signed)
Had to decline appointment with Dr. Isaiah Serge he was given--only given 24 hour notice---called Hayes Ludwig in [place] to follow up with [facility] to reschedule. Patient reports bright red blood in stool several times a week---(INR only 1.9 today) Stools can be pretty firm at times, but not "hard". Is due colonoscopy soon per Dr. Damita Lack MD want to wait till after Dr. Isaiah Serge visit before this is scheduled? Asking if he should reschedule his 11/20 visit with Dr. Truett Perna until after seen by Dr. Isaiah Serge?

## 2011-05-15 ENCOUNTER — Telehealth: Payer: Self-pay | Admitting: *Deleted

## 2011-05-15 NOTE — Telephone Encounter (Signed)
Left VM on cell # that Dr. Truett Perna agreed to delay his appointment until after he is seen by Dr. Isaiah Serge at [facility]. Instructed him to call office when he gets appointment date and we will reschedule his follow up here. Continue same Coumadin and recheck PT/INR on 11/20 as scheduled.

## 2011-05-15 NOTE — Telephone Encounter (Signed)
Message copied by Wandalee Ferdinand on Thu May 15, 2011 11:27 AM ------      Message from: Thornton Papas B      Created: Wed May 14, 2011  8:12 PM       Please call patient- same coumadin ,repeat PT 2-3 weeks.  OK to reschedule office appt.until after seen at [facility].

## 2011-05-27 ENCOUNTER — Ambulatory Visit: Payer: Managed Care, Other (non HMO) | Admitting: Oncology

## 2011-05-27 ENCOUNTER — Other Ambulatory Visit: Payer: Managed Care, Other (non HMO) | Admitting: Lab

## 2011-05-28 ENCOUNTER — Other Ambulatory Visit: Payer: Self-pay | Admitting: Oncology

## 2011-05-28 DIAGNOSIS — I2699 Other pulmonary embolism without acute cor pulmonale: Secondary | ICD-10-CM

## 2011-06-05 ENCOUNTER — Other Ambulatory Visit: Payer: Self-pay | Admitting: *Deleted

## 2011-06-05 ENCOUNTER — Telehealth: Payer: Self-pay | Admitting: Oncology

## 2011-06-05 ENCOUNTER — Encounter: Payer: Self-pay | Admitting: Internal Medicine

## 2011-06-05 ENCOUNTER — Telehealth: Payer: Self-pay | Admitting: *Deleted

## 2011-06-05 NOTE — Telephone Encounter (Signed)
S/w the pt and he is aware of his appt on 06/19/2011@10 :15am

## 2011-06-05 NOTE — Telephone Encounter (Signed)
Pt called after hematologist visit at [facility]. He reports MD suggested he go ahead with colonoscopy. Should have lab results in about 5 days, per pt. Wants to make Dr. Truett Perna aware that he continues to have blood in his stool about 4-5 times per week. Also having constant dull achy pain in abdomen. Pt will call Dr. Lamar Sprinkles office to schedule colonoscopy for mid-Dec.  Order sent to scheduler for MD appt 06/20/11.

## 2011-06-06 ENCOUNTER — Telehealth: Payer: Self-pay | Admitting: Internal Medicine

## 2011-06-06 NOTE — Telephone Encounter (Signed)
lmonvm advising the pt of his appt that has been r/s from 07/03/2011 to 07/02/2011@10 :30am

## 2011-06-09 ENCOUNTER — Telehealth: Payer: Self-pay

## 2011-06-09 ENCOUNTER — Telehealth: Payer: Self-pay | Admitting: *Deleted

## 2011-06-09 NOTE — Telephone Encounter (Signed)
Planned for colonoscopy 06/24/11 with pre visit on 06/11/11. Asking for guidance on Coumadin. Per Dr. Truett Perna, have not yet heard from [facility] on recommendations for coumadin.

## 2011-06-09 NOTE — Telephone Encounter (Signed)
Left message for Dr. Annabell Howells nurse regarding the pts coumadin. Pt is scheduled for colon 06/24/11 and has a previsit Wed. Need to have instruction regarding his coumadin and the colon.

## 2011-06-10 ENCOUNTER — Other Ambulatory Visit (HOSPITAL_BASED_OUTPATIENT_CLINIC_OR_DEPARTMENT_OTHER): Payer: Managed Care, Other (non HMO) | Admitting: Lab

## 2011-06-10 ENCOUNTER — Ambulatory Visit: Payer: Managed Care, Other (non HMO)

## 2011-06-10 DIAGNOSIS — I2699 Other pulmonary embolism without acute cor pulmonale: Secondary | ICD-10-CM

## 2011-06-10 DIAGNOSIS — Z5181 Encounter for therapeutic drug level monitoring: Secondary | ICD-10-CM

## 2011-06-10 DIAGNOSIS — Z7901 Long term (current) use of anticoagulants: Secondary | ICD-10-CM

## 2011-06-10 LAB — PROTIME-INR
INR: 2.4 (ref 2.00–3.50)
Protime: 28.8 Seconds — ABNORMAL HIGH (ref 10.6–13.4)

## 2011-06-10 NOTE — Progress Notes (Unsigned)
Pt saw Dr. Isaiah Serge in [place] on [facility] Nov 29.  He is anticipating a call from them or Dr. Truett Perna in regards to his length of therapy with coumadin and possibly stopping it.  He is therapeutic today at 2.4.  He will continue the same dose of 5mg  on T, Th, Sa and 2.5 mg on other days.  After his discussion with MD's we will proceed with future appmts and plans for f/u prior to his colonoscopy in [facility] December.

## 2011-06-11 ENCOUNTER — Encounter: Payer: Self-pay | Admitting: Internal Medicine

## 2011-06-11 ENCOUNTER — Ambulatory Visit (AMBULATORY_SURGERY_CENTER): Payer: Managed Care, Other (non HMO) | Admitting: *Deleted

## 2011-06-11 VITALS — Ht 73.0 in | Wt 214.3 lb

## 2011-06-11 DIAGNOSIS — K625 Hemorrhage of anus and rectum: Secondary | ICD-10-CM

## 2011-06-11 MED ORDER — PEG-KCL-NACL-NASULF-NA ASC-C 100 G PO SOLR: ORAL | Status: DC

## 2011-06-11 NOTE — Telephone Encounter (Signed)
Pt came for PV today.  He has appointment with Dr. Truett Perna 12/10.  Pt instructed to call our office after 12/10 appointment with Dr. Truett Perna to review  Coumadin instructions prior to colonoscopy.  Ezra Sites

## 2011-06-13 NOTE — Progress Notes (Signed)
Pt aware to continue same dose. He saw Anti-coag pharmacist on 06/10/11.

## 2011-06-16 ENCOUNTER — Telehealth: Payer: Self-pay | Admitting: Oncology

## 2011-06-16 ENCOUNTER — Ambulatory Visit (HOSPITAL_BASED_OUTPATIENT_CLINIC_OR_DEPARTMENT_OTHER): Payer: Managed Care, Other (non HMO) | Admitting: Nurse Practitioner

## 2011-06-16 VITALS — BP 148/84 | HR 83 | Temp 96.9°F | Ht 73.0 in

## 2011-06-16 DIAGNOSIS — L989 Disorder of the skin and subcutaneous tissue, unspecified: Secondary | ICD-10-CM

## 2011-06-16 DIAGNOSIS — C187 Malignant neoplasm of sigmoid colon: Secondary | ICD-10-CM

## 2011-06-16 DIAGNOSIS — Z86711 Personal history of pulmonary embolism: Secondary | ICD-10-CM

## 2011-06-16 DIAGNOSIS — C189 Malignant neoplasm of colon, unspecified: Secondary | ICD-10-CM

## 2011-06-16 NOTE — Progress Notes (Signed)
OFFICE PROGRESS NOTE  Interval history:  Brian Cantrell returns as scheduled. He has a good appetite. He denies weight loss. No pain. He has had no further rectal bleeding. He does note that his stools are "dark". He reports he is scheduled for a colonoscopy 06/24/2011. His main complaint today is a 3-6 week history of "afternoon fatigue".  He is no longer taking Coumadin. He denies shortness of breath and chest pain. No leg swelling or calf pain.  He has recently noted increase in the size of a skin lesion at the right mid back. The lesion is puritic.     Objective: Blood pressure 148/84, pulse 83, temperature 96.9 F (36.1 C), temperature source Oral, height 6\' 1"  (1.854 m).  Oropharynx is without thrush or ulceration. No palpable cervical, supraclavicular, axillary or inguinal lymph nodes. Lungs are clear. No wheezes or rales. Regular cardiac rhythm. Abdomen is soft and nontender. No hepatomegaly. Extremities are without edema. At the right mid back there is an approximate 6 mm raised oval shaped dark pink skin lesion.   Lab Results: Lab Results  Component Value Date   WBC 4.8 12/09/2010   HGB 14.1 12/09/2010   HCT 41.2 12/09/2010   MCV 92.9 12/09/2010   PLT 145 12/09/2010    Chemistry:      Component Value Date/Time   NA 140 11/18/2010 1051   K 4.1 11/18/2010 1051   CL 104 11/18/2010 1051   CO2 26 11/18/2010 1051   GLUCOSE 98 11/18/2010 1051   BUN 15 11/18/2010 1051   CREATININE 0.83 11/18/2010 1051   CALCIUM 9.4 11/18/2010 1051   PROT 6.5 11/18/2010 1051   ALBUMIN 4.2 11/18/2010 1051   AST 33 11/18/2010 1051   ALT 47 11/18/2010 1051   ALKPHOS 112 11/18/2010 1051   BILITOT 0.9 11/18/2010 1051   GFRNONAA >60 05/15/2010 1105   GFRAA  Value: >60        The eGFR has been calculated using the MDRD equation. This calculation has not been validated in all clinical situations. eGFR's persistently <60 mL/min signify possible Chronic Kidney Disease. 05/15/2010 1105   Labs done at [facility] on  06/05/2011-hemoglobin 17 MCV 86 white count 5.7 platelet count 158,000 ferritin 20 d-dimer less than 150  (normal range 0-229).  Studies/Results: No results found.  Medications: I have reviewed the patient's current medications.  Assessment/Plan:  1. 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  Initiation of adjuvant Xeloda chemotherapy 06/27/2010.  He completed the eighth and final cycle of Xeloda beginning 11/22/2010.  2. History of iron deficiency anemia. The hemoglobin was in normal range on outside labs done 06/05/2011. The ferritin was low at 20. We will repeat a CBC and ferritin level at the time of his next lab visit in January 2013.  3. 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  4. 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  5. Hand-foot syndrome secondary to Xeloda, improved with a dose reduction to 1500 mg twice daily beginning with cycle #4 on 08/30/2010.  Resolved.  6. History of an elevated bilirubin and liver enzymes, ?  toxicity related to Xeloda.  7. Acute onset of chest pain 10/28/2010.  A CT of the chest confirmed multiple small emboli at the branches of the right lower lobe and probably left lower lobe pulmonary arteries.  He was maintained on Lovenox anticoagulation until he was started on Coumadin following  an office visit 12/09/2010.  He was evaluated by Dr. Isaiah Serge at [facility] in November of this year. Coumadin was discontinued earlier this month.  8. Hypercoagulation panel 12/09/2010 revealed a mild decrease in the protein S and protein C activity of unclear significance.  The factor V Leiden and prothrombin gene mutations were negative.  A lupus anticoagulant screen was negative.  The beta-2 glycoprotein IgM was elevated.   a. D-dimer at [facility] on 06/05/2011 was in normal range. Coumadin was discontinued earlier this month.     9. Prutitic  skin lesion right mid back-he will contact his dermatologist for evaluation.   Disposition-Brian Cantrell appears stable. Dr. Truett Perna discussed enrollment on the Prevention trial. A research nurse will meet with him while in the office today. Dr. Truett Perna recommended that he begin a daily aspirin. He is scheduled for a colonoscopy later this month. He will return for a lab visit in January to include CEA, CBC and ferritin. He will return for a followup visit with Dr. Truett Perna in 6 months. He will contact the office in the interim with any problems. We will contact [facility] for the beta-2 glycoprotein-1 results from 06/05/2011.  Plan reviewed with Dr. Truett Perna.     Lonna Cobb ANP/GNP-BC

## 2011-06-16 NOTE — Telephone Encounter (Signed)
Pt is aware of his jan,june 2013 appts

## 2011-06-17 ENCOUNTER — Ambulatory Visit: Payer: Self-pay | Admitting: Pharmacist

## 2011-06-17 NOTE — Progress Notes (Signed)
Per Lonna Cobb, pt now off Coumadin. Pt archived in Dose Response. Marily Lente, Pharm.D.

## 2011-06-19 ENCOUNTER — Ambulatory Visit: Payer: Managed Care, Other (non HMO) | Admitting: Nurse Practitioner

## 2011-06-23 ENCOUNTER — Telehealth: Payer: Self-pay | Admitting: *Deleted

## 2011-06-23 DIAGNOSIS — I2699 Other pulmonary embolism without acute cor pulmonale: Secondary | ICD-10-CM

## 2011-06-23 NOTE — Telephone Encounter (Signed)
Confirmed with patient that coag testing at [facility] was negative. No longer needs to take Coumadin. MD wants to check labs again in January as scheduled. He understands and agrees.

## 2011-06-24 ENCOUNTER — Ambulatory Visit: Payer: Managed Care, Other (non HMO) | Admitting: Nurse Practitioner

## 2011-06-24 ENCOUNTER — Ambulatory Visit (AMBULATORY_SURGERY_CENTER): Payer: Managed Care, Other (non HMO) | Admitting: Internal Medicine

## 2011-06-24 ENCOUNTER — Encounter: Payer: Self-pay | Admitting: Internal Medicine

## 2011-06-24 VITALS — BP 137/91 | HR 67 | Temp 98.0°F | Resp 14 | Ht 73.0 in | Wt 214.0 lb

## 2011-06-24 DIAGNOSIS — Z86010 Personal history of colonic polyps: Secondary | ICD-10-CM

## 2011-06-24 DIAGNOSIS — Z85038 Personal history of other malignant neoplasm of large intestine: Secondary | ICD-10-CM

## 2011-06-24 DIAGNOSIS — K625 Hemorrhage of anus and rectum: Secondary | ICD-10-CM

## 2011-06-24 DIAGNOSIS — D126 Benign neoplasm of colon, unspecified: Secondary | ICD-10-CM

## 2011-06-24 DIAGNOSIS — Z1211 Encounter for screening for malignant neoplasm of colon: Secondary | ICD-10-CM

## 2011-06-24 MED ORDER — SODIUM CHLORIDE 0.9 % IV SOLN: 500.0000 mL | INTRAVENOUS | Status: DC

## 2011-06-24 NOTE — Op Note (Signed)
[facility] [address] [place], [place]  [address]  COLONOSCOPY PROCEDURE REPORT  PATIENT:  Brian, Cantrell  MR#:  [id number] BIRTHDATE:  10-08-1965, 45 yrs. old  GENDER:  male ENDOSCOPIST:  Wilhemina Bonito. Eda Keys, MD REF. BY:  Surveillance Program Recall, PROCEDURE DATE:  06/24/2011 PROCEDURE:  Colonoscopy with snare polypectomy x 3 ASA CLASS:  Class II INDICATIONS:  history of colon cancer, surveillance and high-risk screening ; index exam 04-16-2010 w/ obstructing sigmoid lesion s/p resection MEDICATIONS:   MAC sedation, administered by CRNA, propofol (Diprivan) 220 mg IV  DESCRIPTION OF PROCEDURE:   After the risks benefits and alternatives of the procedure were thoroughly explained, informed consent was obtained.  Digital rectal exam was performed and revealed no abnormalities.   The LB CF-[id number] endoscope was introduced through the anus and advanced to the cecum, which was identified by both the appendix and ileocecal valve, without limitations.  The quality of the prep was excellent, using MoviPrep.  The instrument was then slowly withdrawn as the colon was fully examined. <<PROCEDUREIMAGES>>  FINDINGS:  Three polyps were found - 10mm in the cecum, 12 mm in the ascending colon, and 5mm in the descending colon. Polyps were snared without cautery. Retrieval was successful. There was evidence of a prior segmental colectomy  in the sigmoid colon with the anastomosis at 27cm.  Otherwise normal colonoscopy without other polyps, masses, vascular ectasias, or inflammatory changes. Retroflexed views in the rectum revealed no abnormalities.    The time to cecum = 1:53  minutes. The scope was then withdrawn in 11:22  minutes from the cecum and the procedure completed.  COMPLICATIONS:  None ENDOSCOPIC IMPRESSION: 1) Three polyps in the colon - removed 2) Prior segmental colectomy in the sigmoid colon 3) Otherwise normall colonoscopy  RECOMMENDATIONS: 1) Repeat  Colonoscopy in 2 years.  ______________________________ Wilhemina Bonito. Eda Keys, MD  CC:  The Patient; Nelwyn Salisbury, MD; Rolm Baptise, MD; Glenna Fellows, MD  n. eSIGNED:   Wilhemina Bonito. Eda Keys at 06/24/2011 11:23 AM  Wayland Salinas, [id number]

## 2011-06-24 NOTE — Progress Notes (Signed)
1050 I.V. Infiltrated CRNA Shon Hough restarted I.V. IN r-wrist on 2nd attempt. CRNA attempted r-hand x1 unsuccessful.

## 2011-06-24 NOTE — Progress Notes (Signed)
Patient did not experience any of the following events: a burn prior to discharge; a fall within the facility; wrong site/side/patient/procedure/implant event; or a hospital transfer or hospital admission upon discharge from the facility. (G8907) Patient did not have preoperative order for IV antibiotic SSI prophylaxis. (G8918)  

## 2011-06-24 NOTE — Patient Instructions (Signed)
Handout given on polyps  discharge instructions per the blue and green sheets  We will mail you a letter in 1-2 weeks with the pathology results and Dr Lamar Sprinkles recommendations  Repeat colonoscopy in 2 years per report but could change after we receive the pathology and we will notify you of this in the letter you will receive

## 2011-06-25 ENCOUNTER — Telehealth: Payer: Self-pay

## 2011-06-25 NOTE — Telephone Encounter (Signed)

## 2011-06-26 ENCOUNTER — Ambulatory Visit: Payer: Managed Care, Other (non HMO) | Admitting: Nurse Practitioner

## 2011-07-04 ENCOUNTER — Ambulatory Visit: Payer: Managed Care, Other (non HMO) | Admitting: Oncology

## 2011-07-07 ENCOUNTER — Ambulatory Visit: Payer: Managed Care, Other (non HMO) | Admitting: Nurse Practitioner

## 2011-07-28 ENCOUNTER — Other Ambulatory Visit: Payer: Managed Care, Other (non HMO) | Admitting: Lab

## 2011-07-28 DIAGNOSIS — I2699 Other pulmonary embolism without acute cor pulmonale: Secondary | ICD-10-CM

## 2011-07-29 LAB — CARDIOLIPIN ANTIBODIES, IGG, IGM, IGA: Anticardiolipin IgG: 4 GPL U/mL (ref <23)

## 2011-07-29 LAB — PROTEIN S ACTIVITY: Protein S Activity: 119 % (ref 69–129)

## 2011-07-29 LAB — PROTEIN C ACTIVITY: Protein C Activity: 92 % (ref 75–133)

## 2011-07-29 LAB — BETA-2 GLYCOPROTEIN ANTIBODIES
Beta-2 Glyco I IgG: 2 G Units (ref <20)
Beta-2-Glycoprotein I IgA: 8 A Units (ref <20)

## 2011-08-06 ENCOUNTER — Telehealth: Payer: Self-pay | Admitting: *Deleted

## 2011-08-06 ENCOUNTER — Other Ambulatory Visit: Payer: Self-pay | Admitting: *Deleted

## 2011-08-06 DIAGNOSIS — I2699 Other pulmonary embolism without acute cor pulmonale: Secondary | ICD-10-CM

## 2011-08-06 NOTE — Progress Notes (Signed)
Lab results from 1/21 routed to Dr Imagene Gurney at [facility] per Dr Truett Perna.  dph

## 2011-08-06 NOTE — Telephone Encounter (Signed)
Patient notified. Lab orders added to 12/15/11 encounter and copy of labs routed to Dr. Isaiah Serge

## 2011-08-06 NOTE — Telephone Encounter (Signed)
Message copied by Wandalee Ferdinand on Wed Aug 06, 2011  7:42 PM ------      Message from: Thornton Papas B      Created: Sun Aug 03, 2011  3:05 PM       Please call patient, d-dimer, prot C, prot S normal.  Cardiolopin/beta 2 glycoprotein IgM antibody still positive, ? Significance.            Continue off of coumadin, add lupus anticoagulant and Igm cardiolipin, beta 2 glycoprotein antibodies next lab, and d-dimer            F/u as scheduled            Copy lab to Dr. Isaiah Serge

## 2011-12-15 ENCOUNTER — Ambulatory Visit (HOSPITAL_BASED_OUTPATIENT_CLINIC_OR_DEPARTMENT_OTHER): Payer: Managed Care, Other (non HMO) | Admitting: Oncology

## 2011-12-15 ENCOUNTER — Other Ambulatory Visit: Payer: Self-pay | Admitting: Oncology

## 2011-12-15 ENCOUNTER — Other Ambulatory Visit: Payer: Self-pay | Admitting: *Deleted

## 2011-12-15 ENCOUNTER — Other Ambulatory Visit (HOSPITAL_BASED_OUTPATIENT_CLINIC_OR_DEPARTMENT_OTHER): Payer: Managed Care, Other (non HMO) | Admitting: Lab

## 2011-12-15 ENCOUNTER — Telehealth: Payer: Self-pay | Admitting: Oncology

## 2011-12-15 VITALS — BP 144/78 | HR 72 | Temp 97.3°F | Ht 73.0 in | Wt 196.7 lb

## 2011-12-15 DIAGNOSIS — D509 Iron deficiency anemia, unspecified: Secondary | ICD-10-CM

## 2011-12-15 DIAGNOSIS — Z7901 Long term (current) use of anticoagulants: Secondary | ICD-10-CM

## 2011-12-15 DIAGNOSIS — Z5181 Encounter for therapeutic drug level monitoring: Secondary | ICD-10-CM

## 2011-12-15 DIAGNOSIS — C189 Malignant neoplasm of colon, unspecified: Secondary | ICD-10-CM

## 2011-12-15 DIAGNOSIS — D696 Thrombocytopenia, unspecified: Secondary | ICD-10-CM

## 2011-12-15 DIAGNOSIS — I2699 Other pulmonary embolism without acute cor pulmonale: Secondary | ICD-10-CM

## 2011-12-15 DIAGNOSIS — C187 Malignant neoplasm of sigmoid colon: Secondary | ICD-10-CM

## 2011-12-15 DIAGNOSIS — Z86718 Personal history of other venous thrombosis and embolism: Secondary | ICD-10-CM

## 2011-12-15 LAB — CBC WITH DIFFERENTIAL/PLATELET
BASO%: 0.4 % (ref 0.0–2.0)
Eosinophils Absolute: 0.1 10*3/uL (ref 0.0–0.5)
HCT: 47.6 % (ref 38.4–49.9)
LYMPH%: 26.5 % (ref 14.0–49.0)
MCHC: 34 g/dL (ref 32.0–36.0)
MCV: 87.5 fL (ref 79.3–98.0)
MONO#: 0.3 10*3/uL (ref 0.1–0.9)
MONO%: 6.5 % (ref 0.0–14.0)
NEUT%: 63.8 % (ref 39.0–75.0)
Platelets: 114 10*3/uL — ABNORMAL LOW (ref 140–400)
RBC: 5.44 10*6/uL (ref 4.20–5.82)

## 2011-12-15 LAB — CHCC SMEAR

## 2011-12-15 LAB — PROTIME-INR

## 2011-12-15 LAB — TECHNOLOGIST REVIEW

## 2011-12-15 NOTE — Telephone Encounter (Signed)
Gave pt appt calendar appt for July 2013 and December 2013 lab and MD

## 2011-12-15 NOTE — Progress Notes (Signed)
[facility]    OFFICE PROGRESS NOTE   INTERVAL HISTORY:   He remains off of Coumadin anticoagulation. No symptom of recurrent venous thrombosis. He reports an intentional weight loss with exercise and a change in his diet. No difficulty with bowel or bladder function.  He recently had "swelling "of the right testicle after driving to the beach. The testicle was sore. No swelling of the right testicle today.  Objective:  Vital signs in last 24 hours:  Blood pressure 144/78, pulse 72, temperature 97.3 F (36.3 C), temperature source Oral, height 6\' 1"  (1.854 m), weight 196 lb 11.2 oz (89.223 kg).    HEENT: Neck without mass Lymphatics: No cervical, supraclavicular, axillary, or inguinal nodes Resp: Lungs clear bilaterally Cardio: Regular rate and rhythm GI: No hepatomegaly, no mass, nontender Vascular: No leg edema  GU: Testes without mass. No apparent abnormality of the right testicle or scrotum   Portacath/PICC-without erythema  Lab Results:  Lab Results  Component Value Date   WBC 3.9* 12/15/2011   HGB 16.2 12/15/2011   HCT 47.6 12/15/2011   MCV 87.5 12/15/2011   PLT 114* 12/15/2011   ANC 2.5   Medications: I have reviewed the patient's current medications.  Assessment/Plan: 1. 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 Initiation of adjuvant Xeloda chemotherapy 06/27/2010. He completed the eighth and final cycle of Xeloda beginning 11/22/2010.  2. History of iron deficiency anemia. The hemoglobin was in normal range on outside labs done 06/05/2011. The ferritin was low at 20. The hemoglobin is normal today. 3. 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  4. 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  5. Hand-foot syndrome secondary to Xeloda, improved with a dose reduction to 1500 mg twice daily beginning with  cycle #4 on 08/30/2010. Resolved.  6. History of an elevated bilirubin and liver enzymes, ? toxicity related to Xeloda.  7. Acute onset of chest pain 10/28/2010. A CT of the chest confirmed multiple small emboli at the branches of the right lower lobe and probably left lower lobe pulmonary arteries. He was maintained on Lovenox anticoagulation until he was started on Coumadin following an office visit 12/09/2010. He was evaluated by Dr. Isaiah Serge at [facility] in November of this year. Coumadin was discontinued earlier this month.  8. Hypercoagulation panel 12/09/2010 revealed a mild decrease in the protein S and protein C activity of unclear significance. The factor V Leiden and prothrombin gene mutations were negative. A lupus anticoagulant screen was negative. The beta-2 glycoprotein IgM was elevated. Repeat laboratory evaluation for antiphospholipid syndrome was obtained today. a. D-dimer at [facility] on 06/05/2011 was in normal range. Coumadin was discontinued in December of 2012.       9.   discomfort and "swelling "at the right testicle-normal exam today. He will contact us for recurrent symptoms.      10. Mild thrombocytopenia today-? Etiology. He will return for a repeat CBC in 3 weeks   Disposition:  He remains in clinical remission from colon cancer. He underwent a surveillance colonoscopy with removal of polyps in December of 2012. He will continue colonoscopy followup with Dr. Marina Goodell.  Brian Cantrell remains off of anticoagulation. We obtained a repeat laboratory evaluation for antiphospholipid syndrome today.  He will return for an office visit and CEA in 6 months.  He has mild thrombocytopenia today. He will return for a repeat platelet count  3 weeks. We will check a peripheral blood smear for clumping.  Thornton Papas, MD  12/15/2011  2:24 PM

## 2011-12-17 ENCOUNTER — Telehealth: Payer: Self-pay | Admitting: *Deleted

## 2011-12-17 LAB — CARDIOLIPIN ANTIBODIES, IGG, IGM, IGA
Anticardiolipin IgA: 1 APL U/mL (ref <22)
Anticardiolipin IgG: 2 GPL U/mL (ref <23)
Anticardiolipin IgM: 24 MPL U/mL — ABNORMAL HIGH (ref <11)

## 2011-12-17 LAB — CEA: CEA: 1.9 ng/mL (ref 0.0–5.0)

## 2011-12-17 LAB — BETA-2 GLYCOPROTEIN ANTIBODIES
Beta-2 Glyco I IgG: 3 G Units (ref <20)
Beta-2-Glycoprotein I IgA: 9 A Units (ref <20)
Beta-2-Glycoprotein I IgM: 48 M Units — ABNORMAL HIGH (ref <20)

## 2011-12-17 LAB — LUPUS ANTICOAGULANT PANEL
DRVVT: 60.7 secs — ABNORMAL HIGH (ref <45.1)
PTT Lupus Anticoagulant: 44.4 secs — ABNORMAL HIGH (ref 28.0–43.0)
PTTLA 4:1 Mix: 43.6 secs — ABNORMAL HIGH (ref 28.0–43.0)
PTTLA Confirmation: 4.4 secs (ref <8.0)

## 2011-12-17 LAB — D-DIMER, QUANTITATIVE: D-Dimer, Quant: 0.27 ug/mL-FEU (ref 0.00–0.48)

## 2011-12-17 NOTE — Telephone Encounter (Signed)
Called pt with lab results. He verbalized understanding. Next appt confirmed.

## 2011-12-17 NOTE — Telephone Encounter (Signed)
Message copied by Caleb Popp on Wed Dec 17, 2011  3:50 PM ------      Message from: Ladene Artist      Created: Tue Dec 16, 2011 10:10 PM       Please call patient, cea and ferritin are normal,f/u as scheduled

## 2012-01-05 ENCOUNTER — Other Ambulatory Visit: Payer: Managed Care, Other (non HMO)

## 2012-01-05 ENCOUNTER — Other Ambulatory Visit: Payer: Self-pay | Admitting: *Deleted

## 2012-01-05 DIAGNOSIS — I2699 Other pulmonary embolism without acute cor pulmonale: Secondary | ICD-10-CM

## 2012-01-05 DIAGNOSIS — D509 Iron deficiency anemia, unspecified: Secondary | ICD-10-CM

## 2012-01-05 LAB — CBC WITH DIFFERENTIAL/PLATELET
BASO%: 0.3 % (ref 0.0–2.0)
EOS%: 4 % (ref 0.0–7.0)
MCH: 30 pg (ref 27.2–33.4)
MCHC: 34.1 g/dL (ref 32.0–36.0)
NEUT%: 58.9 % (ref 39.0–75.0)
RDW: 12.7 % (ref 11.0–14.6)
lymph#: 1.2 10*3/uL (ref 0.9–3.3)

## 2012-01-07 ENCOUNTER — Telehealth: Payer: Self-pay | Admitting: *Deleted

## 2012-01-07 NOTE — Telephone Encounter (Signed)
Message copied by Caleb Popp on Wed Jan 07, 2012  4:55 PM ------      Message from: Thornton Papas B      Created: Mon Jan 05, 2012 10:19 PM       Please call patient, platelets are stable, call for bleeding, f/u as scheduled            Hb is normal

## 2012-01-07 NOTE — Telephone Encounter (Signed)
Called pt with lab results. He voiced understanding.

## 2012-01-30 ENCOUNTER — Telehealth: Payer: Self-pay | Admitting: *Deleted

## 2012-01-30 NOTE — Telephone Encounter (Signed)
Asking if our office has done TB skin test on him? Going to area on Monday that requires TB testing. Informed him that no TB test has been done, but his last CXR 10/2010 was negative. He may pick up copy of report if needed.

## 2012-02-02 ENCOUNTER — Telehealth: Payer: Self-pay | Admitting: *Deleted

## 2012-02-02 NOTE — Telephone Encounter (Signed)
Asking for copy of his April 2012 CXR to be emailed to him today. Has to go to a facility to prove he does not have TB. Forwarded request to medical records.

## 2012-02-24 ENCOUNTER — Encounter: Payer: Self-pay | Admitting: Family Medicine

## 2012-02-25 ENCOUNTER — Ambulatory Visit (INDEPENDENT_AMBULATORY_CARE_PROVIDER_SITE_OTHER): Payer: Managed Care, Other (non HMO) | Admitting: Family Medicine

## 2012-02-25 ENCOUNTER — Encounter: Payer: Self-pay | Admitting: Family Medicine

## 2012-02-25 VITALS — BP 120/70 | HR 79 | Temp 98.4°F | Wt 200.0 lb

## 2012-02-25 DIAGNOSIS — C189 Malignant neoplasm of colon, unspecified: Secondary | ICD-10-CM

## 2012-02-25 DIAGNOSIS — N529 Male erectile dysfunction, unspecified: Secondary | ICD-10-CM

## 2012-02-25 DIAGNOSIS — Z209 Contact with and (suspected) exposure to unspecified communicable disease: Secondary | ICD-10-CM

## 2012-02-25 MED ORDER — TADALAFIL 20 MG PO TABS: 20.0000 mg | ORAL_TABLET | Freq: Every day | ORAL | Status: DC | PRN

## 2012-02-25 NOTE — Progress Notes (Signed)
  Subjective:    Patient ID: Brian Cantrell, male    DOB: 1965-12-20, 46 y.o.   MRN: [id number]  HPI Here for a PPD for his his job and to ask about help with erections. He had tried Cialis a few years ago and was pleased with the results. He had a colonoscopy last December which showed a few benign polyps, and Dr. Marina Goodell recommneded a 2 year repeat. He feels well. Still sees Dr. Truett Perna twice a year.    Review of Systems  Constitutional: Negative.   Respiratory: Negative.   Cardiovascular: Negative.   Gastrointestinal: Negative.        Objective:   Physical Exam  Constitutional: He appears well-developed and well-nourished.  Cardiovascular: Normal rate, regular rhythm, normal heart sounds and intact distal pulses.   Pulmonary/Chest: Effort normal and breath sounds normal.  Abdominal: Soft. Bowel sounds are normal. He exhibits no distension and no mass. There is no tenderness. There is no rebound and no guarding.          Assessment & Plan:  Given a rx for Cialis. PPD was placed. His is in remission from colon cancer and is doing well.

## 2012-02-25 NOTE — Addendum Note (Signed)
Addended by: Aniceto Boss A on: 02/25/2012 09:54 AM   Modules accepted: Orders

## 2012-06-15 ENCOUNTER — Telehealth: Payer: Self-pay | Admitting: Oncology

## 2012-06-15 ENCOUNTER — Ambulatory Visit (HOSPITAL_BASED_OUTPATIENT_CLINIC_OR_DEPARTMENT_OTHER): Payer: Managed Care, Other (non HMO) | Admitting: Oncology

## 2012-06-15 ENCOUNTER — Other Ambulatory Visit (HOSPITAL_BASED_OUTPATIENT_CLINIC_OR_DEPARTMENT_OTHER): Payer: Managed Care, Other (non HMO)

## 2012-06-15 VITALS — BP 126/83 | HR 75 | Temp 97.4°F | Resp 18 | Ht 73.0 in | Wt 211.0 lb

## 2012-06-15 DIAGNOSIS — D696 Thrombocytopenia, unspecified: Secondary | ICD-10-CM

## 2012-06-15 DIAGNOSIS — C189 Malignant neoplasm of colon, unspecified: Secondary | ICD-10-CM

## 2012-06-15 DIAGNOSIS — C187 Malignant neoplasm of sigmoid colon: Secondary | ICD-10-CM

## 2012-06-15 DIAGNOSIS — Z86718 Personal history of other venous thrombosis and embolism: Secondary | ICD-10-CM

## 2012-06-15 LAB — CBC WITH DIFFERENTIAL/PLATELET
BASO%: 0.2 % (ref 0.0–2.0)
EOS%: 4.1 % (ref 0.0–7.0)
LYMPH%: 30.1 % (ref 14.0–49.0)
MCH: 30.9 pg (ref 27.2–33.4)
MCHC: 34.7 g/dL (ref 32.0–36.0)
MONO#: 0.4 10*3/uL (ref 0.1–0.9)
MONO%: 8.1 % (ref 0.0–14.0)
Platelets: 137 10*3/uL — ABNORMAL LOW (ref 140–400)
RBC: 5.41 10*6/uL (ref 4.20–5.82)
WBC: 4.5 10*3/uL (ref 4.0–10.3)

## 2012-06-15 LAB — CEA: CEA: 1.8 ng/mL (ref 0.0–5.0)

## 2012-06-15 NOTE — Telephone Encounter (Signed)
gv and printed appt schedule for pt for June 2014 ° °

## 2012-06-15 NOTE — Progress Notes (Signed)
   [facility]    OFFICE PROGRESS NOTE   INTERVAL HISTORY:   He returns as scheduled. He had fatigue a few months ago, this has resolved. No pain. No symptom of venous thrombosis. Good appetite.  Objective:  Vital signs in last 24 hours:  Blood pressure 126/83, pulse 75, temperature 97.4 F (36.3 C), temperature source Oral, resp. rate 18, height 6\' 1"  (1.854 m), weight 211 lb (95.709 kg).    HEENT: Neck without mass Lymphatics: No cervical, supraclavicular, axillary, or inguinal nodes Resp: Lungs clear bilaterally Cardio: Regular rate and rhythm GI: No hepatomegaly, no mass, nontender Vascular: No leg edema   Lab Results:  Lab Results  Component Value Date   WBC 4.5 06/15/2012   HGB 16.7 06/15/2012   HCT 48.1 06/15/2012   MCV 89.0 06/15/2012   PLT 137* 06/15/2012      Medications: I have reviewed the patient's current medications.  Assessment/Plan: 1. 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 Initiation of adjuvant Xeloda chemotherapy 06/27/2010. He completed the eighth and final cycle of Xeloda beginning 11/22/2010. Surveillance colonoscopy 06/24/2011-3 polyps were removed (tubular and tubulovillous adenomas) 2. History of iron deficiency anemia. The hemoglobin was in normal range on outside labs done 06/05/2011. The ferritin was low at 20. The hemoglobin is normal today. 3. 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  4. 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  5. Hand-foot syndrome secondary to Xeloda, improved with a dose reduction to 1500 mg twice daily beginning with cycle #4 on 08/30/2010. Resolved.  6. History of an elevated bilirubin and liver enzymes, ? toxicity related to Xeloda.  7. Acute onset of chest pain 10/28/2010. A CT of the chest confirmed multiple small emboli at the branches of the right  lower lobe and probably left lower lobe pulmonary arteries. He was maintained on Lovenox anticoagulation until he was started on Coumadin following an office visit 12/09/2010. He was evaluated by Dr. Isaiah Serge at [facility] . Coumadin was discontinued in December of 2012. 8. Hypercoagulation panel 12/09/2010 revealed a mild decrease in the protein S and protein C activity of unclear significance. The factor V Leiden and prothrombin gene mutations were negative. A lupus anticoagulant screen was negative. The beta-2 glycoprotein IgM was elevated. Repeat laboratory evaluation for antiphospholipid syndrome on 12/15/2011-negative lupus anticoagulant, normal d-dimer, stable mildly elevated beta-2 glycoprotein IgM a. D-dimer at [facility] on 06/05/2011 was in normal range. Normal d-dimer 07/28/2011 and 12/15/2011. Coumadin was discontinued in December of 2012.        9. Mild thrombocytopenia -? Etiology, the platelet count is in the low normal range   Disposition:  He remains in clinical remission from colon cancer. No symptoms to suggest recurrent venous thrombosis. We will follow on the CEA from today. He will return for an office visit in 6 months.   Thornton Papas, MD  06/15/2012  12:43 PM

## 2012-06-16 ENCOUNTER — Telehealth: Payer: Self-pay | Admitting: *Deleted

## 2012-06-16 NOTE — Telephone Encounter (Signed)
Message copied by Caleb Popp on Wed Jun 16, 2012  5:11 PM ------      Message from: Ladene Artist      Created: Tue Jun 15, 2012  9:31 PM       Please call patient, cea is normal

## 2012-06-16 NOTE — Telephone Encounter (Signed)
Called pt with CEA results. He voiced understanding. 

## 2012-12-14 ENCOUNTER — Telehealth: Payer: Self-pay | Admitting: Oncology

## 2012-12-14 ENCOUNTER — Other Ambulatory Visit (HOSPITAL_BASED_OUTPATIENT_CLINIC_OR_DEPARTMENT_OTHER): Payer: Managed Care, Other (non HMO) | Admitting: Lab

## 2012-12-14 ENCOUNTER — Ambulatory Visit (HOSPITAL_BASED_OUTPATIENT_CLINIC_OR_DEPARTMENT_OTHER): Payer: Managed Care, Other (non HMO) | Admitting: Oncology

## 2012-12-14 VITALS — BP 145/81 | HR 70 | Temp 98.0°F | Resp 18 | Ht 73.0 in | Wt 211.0 lb

## 2012-12-14 DIAGNOSIS — C189 Malignant neoplasm of colon, unspecified: Secondary | ICD-10-CM

## 2012-12-14 DIAGNOSIS — C187 Malignant neoplasm of sigmoid colon: Secondary | ICD-10-CM

## 2012-12-14 DIAGNOSIS — D696 Thrombocytopenia, unspecified: Secondary | ICD-10-CM

## 2012-12-14 DIAGNOSIS — Z85038 Personal history of other malignant neoplasm of large intestine: Secondary | ICD-10-CM

## 2012-12-14 LAB — CBC WITH DIFFERENTIAL/PLATELET
BASO%: 0.4 % (ref 0.0–2.0)
EOS%: 4.4 % (ref 0.0–7.0)
HGB: 15.8 g/dL (ref 13.0–17.1)
MCH: 30.8 pg (ref 27.2–33.4)
MCHC: 35.3 g/dL (ref 32.0–36.0)
MONO%: 8.7 % (ref 0.0–14.0)
RBC: 5.12 10*6/uL (ref 4.20–5.82)
RDW: 12.7 % (ref 11.0–14.6)
lymph#: 1.3 10*3/uL (ref 0.9–3.3)

## 2012-12-14 LAB — CEA: CEA: 2.1 ng/mL (ref 0.0–5.0)

## 2012-12-14 NOTE — Progress Notes (Signed)
   [facility]    OFFICE PROGRESS NOTE   INTERVAL HISTORY:   He returns as scheduled. He feels well. He continues to have numbness at the left low abdominal scar. No difficulty with bowel function. No symptoms of recurrent venous thrombosis.  Objective:  Vital signs in last 24 hours:  Blood pressure 145/81, pulse 70, temperature 98 F (36.7 C), temperature source Oral, resp. rate 18, height 6\' 1"  (1.854 m), weight 211 lb (95.709 kg).    HEENT: neck without mass Lymphatics: no cervical, supraclavicular, axillary, or inguinal nodes Resp: lungs clear bilaterally Cardio: regular rate and rhythm GI: no hepatosplenomegaly, nontender, no mass Vascular: no leg edema   Lab Results:  Lab Results  Component Value Date   WBC 5.2 12/14/2012   HGB 15.8 12/14/2012   HCT 44.7 12/14/2012   MCV 87.2 12/14/2012   PLT 140 12/14/2012  ANC 3.2  CEA 2.1  Medications: I have reviewed the patient's current medications.  Assessment/Plan: 1. 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 Initiation of adjuvant Xeloda chemotherapy 06/27/2010. He completed the eighth and final cycle of Xeloda beginning 11/22/2010. Surveillance colonoscopy 06/24/2011-3 polyps were removed (tubular and tubulovillous adenomas) 2. History of iron deficiency anemia. The hemoglobin was in normal range on outside labs done 06/05/2011. The ferritin was low at 20. The hemoglobin is normal today. 3. 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  4. 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  5. Hand-foot syndrome secondary to Xeloda, improved with a dose reduction to 1500 mg twice daily beginning with cycle #4 on 08/30/2010. Resolved.  6. History of an elevated bilirubin and liver enzymes, ? toxicity related to Xeloda.  7. Acute onset of chest pain 10/28/2010. A CT of  the chest confirmed multiple small emboli at the branches of the right lower lobe and probably left lower lobe pulmonary arteries. He was maintained on Lovenox anticoagulation until he was started on Coumadin following an office visit 12/09/2010. He was evaluated by Dr. Isaiah Cantrell at [facility] . Coumadin was discontinued in December of 2012. 8. Hypercoagulation panel 12/09/2010 revealed a mild decrease in the protein S and protein C activity of unclear significance. The factor V Leiden and prothrombin gene mutations were negative. A lupus anticoagulant screen was negative. The beta-2 glycoprotein IgM was elevated. Repeat laboratory evaluation for antiphospholipid syndrome on 12/15/2011-negative lupus anticoagulant, normal d-dimer, stable mildly elevated beta-2 glycoprotein IgM a. D-dimer at [facility] on 06/05/2011 was in normal range. Normal d-dimer 07/28/2011 and 12/15/2011. Coumadin was discontinued in December of 2012.  9. Mild thrombocytopenia -? Etiology, the platelet count is in the low normal range    Disposition:  He remains in clinical remission from colon cancer. He will be scheduled for a surveillance colonoscopy with Dr. Marina Cantrell in December of 2014. Brian Cantrell will return for an office visit and CEA in 6 months.   Brian Papas, MD  12/14/2012  8:44 PM

## 2012-12-15 ENCOUNTER — Telehealth: Payer: Self-pay | Admitting: *Deleted

## 2012-12-15 NOTE — Telephone Encounter (Signed)
Message copied by Wandalee Ferdinand on Wed Dec 15, 2012 10:52 AM ------      Message from: Ladene Artist      Created: Tue Dec 14, 2012 10:09 PM       Please call patient, cea is normal ------

## 2012-12-15 NOTE — Telephone Encounter (Signed)
Notified of results

## 2013-04-05 ENCOUNTER — Telehealth: Payer: Self-pay | Admitting: Family Medicine

## 2013-04-05 MED ORDER — TADALAFIL 20 MG PO TABS: 20.0000 mg | ORAL_TABLET | Freq: Every day | ORAL | Status: DC | PRN

## 2013-04-05 NOTE — Telephone Encounter (Signed)
Left a message for pt that rx ready for pick up.  

## 2013-04-05 NOTE — Telephone Encounter (Signed)
Printed, in your box

## 2013-04-05 NOTE — Telephone Encounter (Signed)
Pt request refill of tadalafil (CIALIS) 20 MG tablet  Pt would like a hard copy so he can get filled on line or somewhere else that is less expensive. Marland Kitchen

## 2013-04-21 ENCOUNTER — Encounter: Payer: Self-pay | Admitting: Internal Medicine

## 2013-05-13 ENCOUNTER — Telehealth: Payer: Self-pay | Admitting: *Deleted

## 2013-05-13 NOTE — Telephone Encounter (Signed)
Message from pt asking to be called with next appt. Also asking if he is scheduled for colonoscopy. Per records seems he should be scheduled for 06/2013. Recommended he call Dr. Lamar Sprinkles office to confirm.

## 2013-07-12 ENCOUNTER — Other Ambulatory Visit: Payer: Managed Care, Other (non HMO)

## 2013-07-12 ENCOUNTER — Ambulatory Visit (HOSPITAL_BASED_OUTPATIENT_CLINIC_OR_DEPARTMENT_OTHER): Payer: Managed Care, Other (non HMO) | Admitting: Oncology

## 2013-07-12 VITALS — BP 140/83 | HR 67 | Temp 97.2°F | Resp 19 | Ht 73.0 in | Wt 215.9 lb

## 2013-07-12 DIAGNOSIS — C189 Malignant neoplasm of colon, unspecified: Secondary | ICD-10-CM

## 2013-07-12 DIAGNOSIS — Z85038 Personal history of other malignant neoplasm of large intestine: Secondary | ICD-10-CM

## 2013-07-12 DIAGNOSIS — D509 Iron deficiency anemia, unspecified: Secondary | ICD-10-CM

## 2013-07-12 LAB — CEA: CEA: 1.9 ng/mL (ref 0.0–5.0)

## 2013-07-12 NOTE — Progress Notes (Signed)
   [name]    OFFICE PROGRESS NOTE   INTERVAL HISTORY:   He returns as scheduled. No difficulty with bowel function. No symptom of venous thrombosis. He continues to work. He reports several episodes of transient malaise over the past few months.  Objective:  Vital signs in last 24 hours:  Blood pressure 140/83, pulse 67, temperature 97.2 F (36.2 C), temperature source Oral, resp. rate 19, height $RemoveBe'6\' 1"'SxJsFUfeU$  (1.854 m), weight 215 lb 14.4 oz (97.932 kg).    HEENT: Neck without mass, the conjunctivae are pink Lymphatics: No cervical, supra-clavicular, axillary, or inguinal nodes Resp: Lungs clear bilaterally Cardio: Regular rate and rhythm GI: No hepatosplenomegaly, nontender, no mass Vascular: No leg edema   Lab Results:  CEA pending   Medications: I have reviewed the patient's current medications.  Assessment/Plan: 1. 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 Initiation of adjuvant Xeloda chemotherapy 06/27/2010. He completed the eighth and final cycle of Xeloda beginning 11/22/2010. Surveillance colonoscopy 06/24/2011-3 polyps were removed (tubular and tubulovillous adenomas) 2. History of iron deficiency anemia. The hemoglobin was in normal range on outside labs done 06/05/2011. The ferritin was low at 20. The hemoglobin was normal 12/14/2012 3. 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  4. 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  5. Hand-foot syndrome secondary to Xeloda, improved with a dose reduction to 1500 mg twice daily beginning with cycle #4 on 08/30/2010. Resolved.  6. History of an elevated bilirubin and liver enzymes, ? toxicity related to Xeloda.  7. Acute onset of chest pain 10/28/2010. A CT of the chest confirmed multiple small emboli at the branches of the right lower lobe and probably left  lower lobe pulmonary arteries. He was maintained on Lovenox anticoagulation until he was started on Coumadin following an office visit 12/09/2010. He was evaluated by Dr. Joan Flores at [facility] . Coumadin was discontinued in December of 2012. 8. Hypercoagulation panel 12/09/2010 revealed a mild decrease in the protein S and protein C activity of unclear significance. The factor V Leiden and prothrombin gene mutations were negative. A lupus anticoagulant screen was negative. The beta-2 glycoprotein IgM was elevated. Repeat laboratory evaluation for antiphospholipid syndrome on 12/15/2011-negative lupus anticoagulant, normal d-dimer, stable mildly elevated beta-2 glycoprotein IgM a. D-dimer at [facility] on 06/05/2011 was in normal range. Normal d-dimer 07/28/2011 and 12/15/2011. Coumadin was discontinued in December of 2012.         9. history of Mild thrombocytopenia    Disposition:  [name] remains in clinical remission from colon cancer. He will return for an office visit and CEA in 6 months. We will check a CBC when he returns in 6 months.  We will refer him to Dr. [name] for a colonoscopy.   Betsy Coder, MD  07/12/2013  12:28 PM

## 2013-07-13 ENCOUNTER — Telehealth: Payer: Self-pay | Admitting: *Deleted

## 2013-07-13 NOTE — Telephone Encounter (Signed)
Left CEA result on voice mail (per patient request at last office visit).

## 2013-07-13 NOTE — Telephone Encounter (Signed)
Message copied by Tania Ade on Wed Jul 13, 2013  1:56 PM ------      Message from: Brian Cantrell      Created: Tue Jul 12, 2013  8:11 PM       Please call patient, cea is normal ------

## 2013-07-14 ENCOUNTER — Encounter: Payer: Self-pay | Admitting: Internal Medicine

## 2013-07-14 ENCOUNTER — Telehealth: Payer: Self-pay | Admitting: Oncology

## 2013-07-14 NOTE — Telephone Encounter (Signed)
s.w. pt and advised on Feb Dr. [name] appt 2.3 @ 1:30 for office visit and 2.17.15 @ 9am for colonoscopy....mailed pt appt sched and avs for pt for Feb and July 2015

## 2013-08-09 ENCOUNTER — Ambulatory Visit (AMBULATORY_SURGERY_CENTER): Payer: Self-pay

## 2013-08-09 VITALS — Ht 73.0 in | Wt 208.0 lb

## 2013-08-09 DIAGNOSIS — C189 Malignant neoplasm of colon, unspecified: Secondary | ICD-10-CM

## 2013-08-09 MED ORDER — MOVIPREP 100 G PO SOLR: 1.0000 | Freq: Once | ORAL | Status: DC

## 2013-08-12 ENCOUNTER — Encounter: Payer: Self-pay | Admitting: Internal Medicine

## 2013-08-23 ENCOUNTER — Encounter: Payer: Self-pay | Admitting: Internal Medicine

## 2013-08-23 ENCOUNTER — Ambulatory Visit (AMBULATORY_SURGERY_CENTER): Payer: Managed Care, Other (non HMO) | Admitting: Internal Medicine

## 2013-08-23 VITALS — BP 139/92 | HR 58 | Temp 97.0°F | Resp 14 | Ht 73.0 in | Wt 208.0 lb

## 2013-08-23 DIAGNOSIS — Z1211 Encounter for screening for malignant neoplasm of colon: Secondary | ICD-10-CM

## 2013-08-23 DIAGNOSIS — Z85038 Personal history of other malignant neoplasm of large intestine: Secondary | ICD-10-CM

## 2013-08-23 DIAGNOSIS — D126 Benign neoplasm of colon, unspecified: Secondary | ICD-10-CM

## 2013-08-23 DIAGNOSIS — C189 Malignant neoplasm of colon, unspecified: Secondary | ICD-10-CM

## 2013-08-23 MED ORDER — SODIUM CHLORIDE 0.9 % IV SOLN: 500.0000 mL | INTRAVENOUS | Status: DC

## 2013-08-23 NOTE — Op Note (Signed)
[facility]  [address] [place], [address]   COLONOSCOPY PROCEDURE REPORT  PATIENT: Brian Cantrell, Brian Cantrell  MR#: [id number] BIRTHDATE: September 25, 1965 , 47  yrs. old GENDER: Male ENDOSCOPIST: Eustace Quail, MD REFERRED [id number] Program Recall PROCEDURE DATE:  08/23/2013 PROCEDURE:   Colonoscopy with snare polypectomy x 1 First Screening Colonoscopy - Avg.  risk and is 50 yrs.  old or older - No.  Prior Negative Screening - Now for repeat screening. N/A  History of Adenoma - Now for follow-up colonoscopy & has been > or = to 3 yrs.  N/A  Polyps Removed Today? Yes. ASA CLASS:   Class II INDICATIONS:High risk patient with personal history of colon cancer. Sigmoid colon cancer 04-2010; f/u 06-2011 w/ advanced adenomas MEDICATIONS: MAC sedation, administered by CRNA and propofol (Diprivan) 250mg  IV  DESCRIPTION OF PROCEDURE:   After the risks benefits and alternatives of the procedure were thoroughly explained, informed consent was obtained.  A digital rectal exam revealed no abnormalities of the rectum.   The LB [id number]  endoscope was introduced through the anus and advanced to the cecum, which was identified by both the appendix and ileocecal valve. No adverse events experienced.   The quality of the prep was excellent, using MoviPrep  The instrument was then slowly withdrawn as the colon was fully examined.  COLON FINDINGS: A sessile polyp measuring 6 mm in size was found at the cecum.  A polypectomy was performed with a cold snare.  The resection was complete and the polyp tissue was completely retrieved.   There was evidence of a prior  surgical anastomosis in the sigmoid colon at 30 cm.   The colon mucosa was otherwise normal.  Retroflexed views revealed internal hemorrhoids. The time to cecum=2 minutes 43 seconds.  Withdrawal time=10 minutes 05 seconds.  The scope was withdrawn and the procedure completed. COMPLICATIONS: There were no  complications.  ENDOSCOPIC IMPRESSION: 1.   Sessile polyp measuring 6 mm in size was found at the cecum; polypectomy was performed with a cold snare 2.   There was evidence of a prior colo-colonic surgical anastomosis in the sigmoid colon 3.   The colon mucosa was otherwise normal  RECOMMENDATIONS: 1. Repeat Colonoscopy in 2 years.   eSigned:  Eustace Quail, MD 08/23/2013 10:35 AM   cc: Laurey Morale, MD, Kavin Leech, MD, and The Patient

## 2013-08-23 NOTE — Progress Notes (Signed)
Report to pacu rn, vss, bbs=clear 

## 2013-08-23 NOTE — Progress Notes (Signed)
Called to room to assist during endoscopic procedure.  Patient ID and intended procedure confirmed with present staff. Received instructions for my participation in the procedure from the performing physician.  

## 2013-08-23 NOTE — Patient Instructions (Signed)
YOU HAD AN ENDOSCOPIC PROCEDURE TODAY AT THE [facility]: Refer to the procedure report that was given to you for any specific questions about what was found during the examination.  If the procedure report does not answer your questions, please call your gastroenterologist to clarify.  If you requested that your care partner not be given the details of your procedure findings, then the procedure report has been included in a sealed envelope for you to review at your convenience later.  YOU SHOULD EXPECT: Some feelings of bloating in the abdomen. Passage of more gas than usual.  Walking can help get rid of the air that was put into your GI tract during the procedure and reduce the bloating. If you had a lower endoscopy (such as a colonoscopy or flexible sigmoidoscopy) you may notice spotting of blood in your stool or on the toilet paper. If you underwent a bowel prep for your procedure, then you may not have a normal bowel movement for a few days.  DIET: Your first meal following the procedure should be a light meal and then it is ok to progress to your normal diet.  A half-sandwich or bowl of soup is an example of a good first meal.  Heavy or fried foods are harder to digest and may make you feel nauseous or bloated.  Likewise meals heavy in dairy and vegetables can cause extra gas to form and this can also increase the bloating.  Drink plenty of fluids but you should avoid alcoholic beverages for 24 hours.  ACTIVITY: Your care partner should take you home directly after the procedure.  You should plan to take it easy, moving slowly for the rest of the day.  You can resume normal activity the day after the procedure however you should NOT DRIVE or use heavy machinery for 24 hours (because of the sedation medicines used during the test).    SYMPTOMS TO REPORT IMMEDIATELY: A gastroenterologist can be reached at any hour.  During normal business hours, 8:30 AM to 5:00 PM Monday through Friday,  call [phone].  After hours and on weekends, please call the GI answering service at [phone] who will take a message and have the physician on call contact you.   Following lower endoscopy (colonoscopy or flexible sigmoidoscopy):  Excessive amounts of blood in the stool  Significant tenderness or worsening of abdominal pains  Swelling of the abdomen that is new, acute  Fever of 100F or higher    FOLLOW UP: If any biopsies were taken you will be contacted by phone or by letter within the next 1-3 weeks.  Call your gastroenterologist if you have not heard about the biopsies in 3 weeks.  Our staff will call the home number listed on your records the next business day following your procedure to check on you and address any questions or concerns that you may have at that time regarding the information given to you following your procedure. This is a courtesy call and so if there is no answer at the home number and we have not heard from you through the emergency physician on call, we will assume that you have returned to your regular daily activities without incident.  SIGNATURES/CONFIDENTIALITY: You and/or your care partner have signed paperwork which will be entered into your electronic medical record.  These signatures attest to the fact that that the information above on your After Visit Summary has been reviewed and is understood.  Full responsibility of the confidentiality   of this discharge information lies with you and/or your care-partner.  Polyp information given.  Recall colonoscopy in 2 years.

## 2013-08-24 ENCOUNTER — Telehealth: Payer: Self-pay | Admitting: *Deleted

## 2013-08-24 NOTE — Telephone Encounter (Signed)
Message left

## 2013-08-29 ENCOUNTER — Encounter: Payer: Self-pay | Admitting: Internal Medicine

## 2014-01-12 ENCOUNTER — Ambulatory Visit (HOSPITAL_BASED_OUTPATIENT_CLINIC_OR_DEPARTMENT_OTHER): Payer: Managed Care, Other (non HMO) | Admitting: Oncology

## 2014-01-12 ENCOUNTER — Other Ambulatory Visit (HOSPITAL_BASED_OUTPATIENT_CLINIC_OR_DEPARTMENT_OTHER): Payer: Managed Care, Other (non HMO)

## 2014-01-12 VITALS — BP 138/76 | HR 75 | Temp 98.6°F | Resp 18 | Ht 73.0 in | Wt 211.1 lb

## 2014-01-12 DIAGNOSIS — C189 Malignant neoplasm of colon, unspecified: Secondary | ICD-10-CM

## 2014-01-12 DIAGNOSIS — Z85038 Personal history of other malignant neoplasm of large intestine: Secondary | ICD-10-CM

## 2014-01-12 DIAGNOSIS — D509 Iron deficiency anemia, unspecified: Secondary | ICD-10-CM

## 2014-01-12 LAB — CBC WITH DIFFERENTIAL/PLATELET
BASO%: 0.4 % (ref 0.0–2.0)
Basophils Absolute: 0 10*3/uL (ref 0.0–0.1)
EOS%: 3.2 % (ref 0.0–7.0)
Eosinophils Absolute: 0.1 10*3/uL (ref 0.0–0.5)
HCT: 48.2 % (ref 38.4–49.9)
HGB: 16.2 g/dL (ref 13.0–17.1)
LYMPH%: 29 % (ref 14.0–49.0)
MCH: 29.8 pg (ref 27.2–33.4)
MCHC: 33.6 g/dL (ref 32.0–36.0)
MCV: 88.7 fL (ref 79.3–98.0)
MONO#: 0.3 10*3/uL (ref 0.1–0.9)
MONO%: 7.1 % (ref 0.0–14.0)
NEUT#: 2.8 10*3/uL (ref 1.5–6.5)
NEUT%: 60.3 % (ref 39.0–75.0)
Platelets: 147 10*3/uL (ref 140–400)
RBC: 5.43 10*6/uL (ref 4.20–5.82)
RDW: 12.7 % (ref 11.0–14.6)
WBC: 4.6 10*3/uL (ref 4.0–10.3)
lymph#: 1.3 10*3/uL (ref 0.9–3.3)

## 2014-01-12 LAB — CEA: CEA: 2.4 ng/mL (ref 0.0–5.0)

## 2014-01-12 NOTE — Progress Notes (Signed)
  Brian Cantrell   Diagnosis: Colon cancer  INTERVAL HISTORY:   He returns as scheduled. He feels well. Good appetite and energy level. No difficulty with bowel function. No bleeding. A surveillance colonoscopy 08/23/2013 revealed a sessile polyp at the cecum. The polyp was removed and the pathology revealed a tubular adenoma.  Objective:  Vital signs in last 24 hours:  Blood pressure 138/76, pulse 75, temperature 98.6 F (37 C), temperature source Oral, resp. rate 18, height $RemoveBe'6\' 1"'JFRfienxM$  (1.854 m), weight 211 lb 1.6 oz (95.754 kg).    HEENT: Neck without mass Lymphatics: No cervical, supraclavicular, axillary, or inguinal nodes Resp: Lungs clear bilaterally Cardio: Regular rate and rhythm GI: No hepatomegaly, nontender, no mass Vascular: No leg edema   Lab Results:  Lab Results  Component Value Date   WBC 4.6 01/12/2014   HGB 16.2 01/12/2014   HCT 48.2 01/12/2014   MCV 88.7 01/12/2014   PLT 147 01/12/2014   NEUTROABS 2.8 01/12/2014     Lab Results  Component Value Date   CEA 1.9 07/12/2013    Imaging:  No results found.  Medications: I have reviewed the patient's current medications.  Assessment/Plan: 1. 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 Initiation of adjuvant Xeloda chemotherapy 06/27/2010. He completed the eighth and final cycle of Xeloda beginning 11/22/2010.   Surveillance colonoscopy 06/24/2011-3 polyps were removed (tubular and tubulovillous adenomas)  Surveillance colonoscopy 08/23/2013-tubular adenoma removed from the cecum 2. History of iron deficiency anemia. The hemoglobin was in normal range on outside labs done 06/05/2011. The ferritin was low at 20. The hemoglobin is normal. 3. 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  4. 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   5. Hand-foot syndrome secondary to Xeloda, improved with a dose reduction to 1500 mg twice daily beginning with cycle #4 on 08/30/2010. Resolved.  6. History of an elevated bilirubin and liver enzymes, ? toxicity related to Xeloda.  7. Acute onset of chest pain 10/28/2010. A CT of the chest confirmed multiple small emboli at the branches of the right lower lobe and probably left lower lobe pulmonary arteries. He was maintained on Lovenox anticoagulation until he was started on Coumadin following an office visit 12/09/2010. He was evaluated by Dr. Joan Flores at [facility] . Coumadin was discontinued in December of 2012. 8. Hypercoagulation panel 12/09/2010 revealed a mild decrease in the protein S and protein C activity of unclear significance. The factor V Leiden and prothrombin gene mutations were negative. A lupus anticoagulant screen was negative. The beta-2 glycoprotein IgM was elevated. Repeat laboratory evaluation for antiphospholipid syndrome on 12/15/2011-negative lupus anticoagulant, normal d-dimer, stable mildly elevated beta-2 glycoprotein IgM a. D-dimer at [facility] on 06/05/2011 was in normal range. Normal d-dimer 07/28/2011 and 12/15/2011. Coumadin was discontinued in December of 2012.        9. history of Mild thrombocytopenia   Disposition:  Brian Cantrell remains in clinical remission from colon cancer. He will return for an office visit in one year. He will be scheduled for a CEA in 6 months. He continues surveillance colonoscopies with Dr. [name].  Betsy Coder, MD  01/12/2014  9:32 AM

## 2014-01-13 ENCOUNTER — Telehealth: Payer: Self-pay | Admitting: Oncology

## 2014-01-13 ENCOUNTER — Telehealth: Payer: Self-pay | Admitting: *Deleted

## 2014-01-13 NOTE — Telephone Encounter (Signed)
s.w. pt and advised on Jan and July appt...pt ok and aware

## 2014-01-13 NOTE — Telephone Encounter (Signed)
Left message on voicemail informing pt of normal results.

## 2014-01-13 NOTE — Telephone Encounter (Signed)
Message copied by Brien Few on Fri Jan 13, 2014 10:34 AM ------      Message from: Betsy Coder B      Created: Thu Jan 12, 2014  5:45 PM       Please call patient, cea is normal ------

## 2014-07-13 ENCOUNTER — Other Ambulatory Visit (HOSPITAL_BASED_OUTPATIENT_CLINIC_OR_DEPARTMENT_OTHER): Payer: Managed Care, Other (non HMO)

## 2014-07-13 DIAGNOSIS — Z85038 Personal history of other malignant neoplasm of large intestine: Secondary | ICD-10-CM

## 2014-07-13 DIAGNOSIS — C189 Malignant neoplasm of colon, unspecified: Secondary | ICD-10-CM

## 2014-07-14 LAB — CEA: CEA: 2 ng/mL (ref 0.0–5.0)

## 2014-07-18 ENCOUNTER — Telehealth: Payer: Self-pay | Admitting: *Deleted

## 2014-07-18 NOTE — Telephone Encounter (Signed)
-----   Message from [name], MD sent at 07/14/2014  6:24 PM EST ----- Please call patient, Brian Cantrell is normal

## 2014-07-18 NOTE — Telephone Encounter (Signed)
Per Dr. Benay Spice; left voice message cea is normal; any questions to call office.

## 2014-12-25 ENCOUNTER — Ambulatory Visit (INDEPENDENT_AMBULATORY_CARE_PROVIDER_SITE_OTHER): Payer: Managed Care, Other (non HMO) | Admitting: Family Medicine

## 2014-12-25 ENCOUNTER — Encounter: Payer: Self-pay | Admitting: Family Medicine

## 2014-12-25 VITALS — BP 125/76 | HR 74 | Temp 98.4°F | Ht 73.0 in | Wt 210.0 lb

## 2014-12-25 DIAGNOSIS — L309 Dermatitis, unspecified: Secondary | ICD-10-CM | POA: Insufficient documentation

## 2014-12-25 DIAGNOSIS — F909 Attention-deficit hyperactivity disorder, unspecified type: Secondary | ICD-10-CM | POA: Insufficient documentation

## 2014-12-25 DIAGNOSIS — F900 Attention-deficit hyperactivity disorder, predominantly inattentive type: Secondary | ICD-10-CM

## 2014-12-25 MED ORDER — LISDEXAMFETAMINE DIMESYLATE 30 MG PO CAPS: 30.0000 mg | ORAL_CAPSULE | Freq: Every day | ORAL | Status: DC

## 2014-12-25 MED ORDER — TRIAMCINOLONE ACETONIDE 0.1 % EX CREA: 1.0000 "application " | TOPICAL_CREAM | Freq: Two times a day (BID) | CUTANEOUS | Status: DC

## 2014-12-25 NOTE — Progress Notes (Signed)
   Subjective:    Patient ID: Brian Cantrell, male    DOB: [birth year], 49 y.o.   MRN: [id number]  HPI Here for 2 things. First he asks for a medication to help him focus. All his life he has had trouble focusing on tasks and completing them on time. This has affected his job, and he puts a lot of stress on himself by pushing deadlines. I treat his son for ADHD, and he is doing well on Vyvanse. Brian Cantrell has tried some of this, and he is very pleased with the results. Also he descibes an itcy rash on the lower legs for the past few months.    Review of Systems  Constitutional: Negative.   Respiratory: Negative.   Cardiovascular: Negative.   Skin: Positive for rash.  Neurological: Negative.   Psychiatric/Behavioral: Positive for decreased concentration. Negative for confusion and dysphoric mood. The patient is not nervous/anxious.        Objective:   Physical Exam  Constitutional: He is oriented to person, place, and time. He appears well-developed and well-nourished.  Cardiovascular: Normal rate, regular rhythm, normal heart sounds and intact distal pulses.   Pulmonary/Chest: Effort normal and breath sounds normal.  Neurological: He is alert and oriented to person, place, and time.  Skin:  Left lower leg has a patch of red scaly skin   Psychiatric: He has a normal mood and affect. His behavior is normal. Thought content normal.          Assessment & Plan:  He seems to have typical ADHDso he will try Vyvanse 30 mg daily. Treat the eczema wit Triamcinolone cream. Recheck in one month

## 2014-12-25 NOTE — Progress Notes (Signed)
Pre visit review using our clinic review tool, if applicable. No additional management support is needed unless otherwise documented below in the visit note. 

## 2015-01-10 ENCOUNTER — Telehealth: Payer: Self-pay

## 2015-01-10 NOTE — Telephone Encounter (Signed)
Pt stated "back in Jan. Dr Benay Spice said you do not need to be seen but once per year". Pt has been having stomach pains especially when he is upset. He wants to discuss this. His appt tomorrow is for both lab and Dr Benay Spice. He only got labs in January and last OV with Dr Benay Spice was in July of 2015. Pt is glad for appt with Dr Benay Spice tomorrow.

## 2015-01-11 ENCOUNTER — Ambulatory Visit (HOSPITAL_BASED_OUTPATIENT_CLINIC_OR_DEPARTMENT_OTHER): Payer: Managed Care, Other (non HMO) | Admitting: Oncology

## 2015-01-11 ENCOUNTER — Other Ambulatory Visit (HOSPITAL_BASED_OUTPATIENT_CLINIC_OR_DEPARTMENT_OTHER): Payer: Managed Care, Other (non HMO)

## 2015-01-11 ENCOUNTER — Telehealth: Payer: Self-pay | Admitting: Oncology

## 2015-01-11 VITALS — BP 126/69 | HR 64 | Temp 98.3°F | Resp 18 | Ht 73.0 in | Wt 209.0 lb

## 2015-01-11 DIAGNOSIS — C189 Malignant neoplasm of colon, unspecified: Secondary | ICD-10-CM

## 2015-01-11 DIAGNOSIS — Z85038 Personal history of other malignant neoplasm of large intestine: Secondary | ICD-10-CM

## 2015-01-11 LAB — CBC WITH DIFFERENTIAL/PLATELET
BASO%: 0.6 % (ref 0.0–2.0)
BASOS ABS: 0 10*3/uL (ref 0.0–0.1)
EOS%: 1.8 % (ref 0.0–7.0)
Eosinophils Absolute: 0.1 10*3/uL (ref 0.0–0.5)
HCT: 47.5 % (ref 38.4–49.9)
HEMOGLOBIN: 16.2 g/dL (ref 13.0–17.1)
LYMPH#: 1.2 10*3/uL (ref 0.9–3.3)
LYMPH%: 24.9 % (ref 14.0–49.0)
MCH: 29.7 pg (ref 27.2–33.4)
MCHC: 34.2 g/dL (ref 32.0–36.0)
MCV: 86.9 fL (ref 79.3–98.0)
MONO#: 0.3 10*3/uL (ref 0.1–0.9)
MONO%: 6.8 % (ref 0.0–14.0)
NEUT#: 3.2 10*3/uL (ref 1.5–6.5)
NEUT%: 65.9 % (ref 39.0–75.0)
Platelets: 141 10*3/uL (ref 140–400)
RBC: 5.46 10*6/uL (ref 4.20–5.82)
RDW: 12.2 % (ref 11.0–14.6)
WBC: 4.9 10*3/uL (ref 4.0–10.3)

## 2015-01-11 LAB — CEA: CEA: 2 ng/mL (ref 0.0–5.0)

## 2015-01-11 NOTE — Telephone Encounter (Signed)
Pt confirmed labs/ov per 07/07 POF, gave pt AVS and Calendar.... KJ °

## 2015-01-11 NOTE — Progress Notes (Signed)
[place] OFFICE PROGRESS NOTE   Diagnosis: Colon cancer  INTERVAL HISTORY:   Brian Cantrell returns as scheduled. He feels well. He is working. No difficulty with bowel or bladder function. No bleeding. No symptom of venous thrombosis. He complains of intermittent sharp discomfort in various places of the abdomen for the past several months. The pain resolved spontaneously. No associated symptoms. He thinks the abdomen is mildly distended. His attention improved significantly after starting on vyvanse a few weeks ago. The abdominal discomfort predates the start of Vyvanse. Objective:  Vital signs in last 24 hours:  Blood pressure 126/69, pulse 64, temperature 98.3 F (36.8 C), temperature source Oral, resp. rate 18, height _0  (1.854 m), weight 209 lb (94.802 kg), SpO2 98 %.    HEENT: Neck without mass Lymphatics: No cervical, supra-clavicular, axillary, or inguinal nodes Resp: Lungs clear bilaterally Cardio: Regular rate and rhythm GI: No hepatosplenomegaly, no mass, nontender, no apparent ascites Vascular: No leg edema   Lab Results:  Lab Results  Component Value Date   WBC 4.9 01/11/2015   HGB 16.2 01/11/2015   HCT 47.5 01/11/2015   MCV 86.9 01/11/2015   PLT 141 01/11/2015   NEUTROABS 3.2 01/11/2015    Lab Results  Component Value Date   CEA 2.0 07/13/2014     Medications: I have reviewed the patient's current medications.  Assessment/Plan: 1. 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 Initiation of adjuvant Xeloda chemotherapy 06/27/2010. He completed the eighth and final cycle of Xeloda beginning 11/22/2010.   Surveillance colonoscopy 06/24/2011-3 polyps were removed (tubular and tubulovillous adenomas)  Surveillance colonoscopy 08/23/2013-tubular adenoma removed from the cecum 2. History of iron deficiency anemia. The hemoglobin was in normal range on outside  labs done 06/05/2011. The ferritin was low at 20. The hemoglobin is normal. 3. 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  4. 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  5. Hand-foot syndrome secondary to Xeloda, improved with a dose reduction to 1500 mg twice daily beginning with cycle #4 on 08/30/2010. Resolved.  6. History of an elevated bilirubin and liver enzymes, ? toxicity related to Xeloda.  7. Acute onset of chest pain 10/28/2010. A CT of the chest confirmed multiple small emboli at the branches of the right lower lobe and probably left lower lobe pulmonary arteries. He was maintained on Lovenox anticoagulation until he was started on Coumadin following an office visit 12/09/2010. He was evaluated by Dr. Joan Flores at [facility] . Coumadin was discontinued in December of 2012. 8. Hypercoagulation panel 12/09/2010 revealed a mild decrease in the protein S and protein C activity of unclear significance. The factor V Leiden and prothrombin gene mutations were negative. A lupus anticoagulant screen was negative. The beta-2 glycoprotein IgM was elevated. Repeat laboratory evaluation for antiphospholipid syndrome on 12/15/2011-negative lupus anticoagulant, normal d-dimer, stable mildly elevated beta-2 glycoprotein IgM 1. D-dimer at [facility] on 06/05/2011 was in normal range. Normal d-dimer 07/28/2011 and 12/15/2011. Coumadin was discontinued in December of 2012.  9. history of Mild thrombocytopenia    Disposition:  Brian Cantrell remains in clinical remission from colon cancer. He would like to continue follow-up at the [facility] He will return for a CEA in 6 months and an office visit in one year.  He continues colonoscopy follow-up with Dr. [name]. I suspect the intermittent abdominal discomfort is related to a benign condition. He will  contact us or follow-up with Dr. [name] if this persists or if he develops associated  symptoms.  Betsy Coder, MD  01/11/2015  10:22 AM

## 2015-01-12 ENCOUNTER — Telehealth: Payer: Self-pay | Admitting: *Deleted

## 2015-01-12 NOTE — Telephone Encounter (Signed)
-----   Message from [name], MD sent at 01/11/2015  8:07 PM EDT ----- Please call patient, cea is normal

## 2015-01-12 NOTE — Telephone Encounter (Signed)
Per Dr. Sherrill; notified pt that cea is normal.  Pt verbalized understanding and expressed appreciation for call. 

## 2015-02-15 ENCOUNTER — Telehealth: Payer: Self-pay | Admitting: Family Medicine

## 2015-02-15 NOTE — Telephone Encounter (Signed)
Pt request refill lisdexamfetamine (VYVANSE) 30 MG capsule  Pt states it is working great, however he would like to go down to 20 mg if that is ok?  Pt aware dr fry out until Monday. Pt ok w/ that.

## 2015-02-15 NOTE — Telephone Encounter (Signed)
Dr. Sarajane Jews is out of office this week, I spoke with pt and will forward this note. Pt still has enough of medication for now.

## 2015-02-20 MED ORDER — LISDEXAMFETAMINE DIMESYLATE 20 MG PO CAPS: 20.0000 mg | ORAL_CAPSULE | Freq: Every day | ORAL | Status: DC

## 2015-02-20 NOTE — Telephone Encounter (Signed)
rx written for 20 mg

## 2015-02-20 NOTE — Telephone Encounter (Signed)
Script is ready for pick up and I spoke with pt.  

## 2015-05-11 ENCOUNTER — Encounter: Payer: Self-pay | Admitting: Internal Medicine

## 2015-07-12 ENCOUNTER — Other Ambulatory Visit (HOSPITAL_BASED_OUTPATIENT_CLINIC_OR_DEPARTMENT_OTHER): Payer: Managed Care, Other (non HMO)

## 2015-07-12 DIAGNOSIS — Z85038 Personal history of other malignant neoplasm of large intestine: Secondary | ICD-10-CM

## 2015-07-12 DIAGNOSIS — C189 Malignant neoplasm of colon, unspecified: Secondary | ICD-10-CM

## 2015-07-13 ENCOUNTER — Telehealth: Payer: Self-pay | Admitting: *Deleted

## 2015-07-13 LAB — CEA: CEA: 1.7 ng/mL (ref 0.0–5.0)

## 2015-07-13 NOTE — Telephone Encounter (Signed)
Left message on voicemail informing pt of normal lab. 

## 2015-07-13 NOTE — Telephone Encounter (Signed)
-----   Message from Brian Pier, MD sent at 07/13/2015  7:27 AM EST ----- Please call patient, cea is normal

## 2015-08-14 ENCOUNTER — Telehealth: Payer: Self-pay | Admitting: Family Medicine

## 2015-08-14 NOTE — Telephone Encounter (Signed)
Pt needs new rx  vyvanse 20 mg °

## 2015-08-15 MED ORDER — LISDEXAMFETAMINE DIMESYLATE 20 MG PO CAPS: 20.0000 mg | ORAL_CAPSULE | Freq: Every day | ORAL | Status: DC

## 2015-08-15 NOTE — Telephone Encounter (Signed)
Script is ready for pick up and I spoke with pt.  

## 2015-08-15 NOTE — Telephone Encounter (Signed)
done

## 2015-09-11 ENCOUNTER — Encounter: Payer: Self-pay | Admitting: Internal Medicine

## 2016-01-10 ENCOUNTER — Other Ambulatory Visit (HOSPITAL_BASED_OUTPATIENT_CLINIC_OR_DEPARTMENT_OTHER): Payer: Managed Care, Other (non HMO)

## 2016-01-10 ENCOUNTER — Telehealth: Payer: Self-pay | Admitting: Oncology

## 2016-01-10 ENCOUNTER — Ambulatory Visit (HOSPITAL_BASED_OUTPATIENT_CLINIC_OR_DEPARTMENT_OTHER): Payer: Managed Care, Other (non HMO) | Admitting: Oncology

## 2016-01-10 VITALS — BP 137/74 | HR 61 | Temp 98.5°F | Resp 20 | Ht 73.0 in | Wt 198.3 lb

## 2016-01-10 DIAGNOSIS — Z85038 Personal history of other malignant neoplasm of large intestine: Secondary | ICD-10-CM | POA: Diagnosis not present

## 2016-01-10 DIAGNOSIS — C189 Malignant neoplasm of colon, unspecified: Secondary | ICD-10-CM

## 2016-01-10 DIAGNOSIS — C187 Malignant neoplasm of sigmoid colon: Secondary | ICD-10-CM

## 2016-01-10 LAB — CBC WITH DIFFERENTIAL/PLATELET
BASO%: 0.2 % (ref 0.0–2.0)
Basophils Absolute: 0 10*3/uL (ref 0.0–0.1)
EOS%: 14 % — ABNORMAL HIGH (ref 0.0–7.0)
Eosinophils Absolute: 0.8 10*3/uL — ABNORMAL HIGH (ref 0.0–0.5)
HCT: 43.2 % (ref 38.4–49.9)
HGB: 15 g/dL (ref 13.0–17.1)
LYMPH%: 17.2 % (ref 14.0–49.0)
MCH: 29.9 pg (ref 27.2–33.4)
MCHC: 34.7 g/dL (ref 32.0–36.0)
MCV: 86.2 fL (ref 79.3–98.0)
MONO#: 0.4 10*3/uL (ref 0.1–0.9)
MONO%: 6.5 % (ref 0.0–14.0)
NEUT%: 62.1 % (ref 39.0–75.0)
NEUTROS ABS: 3.7 10*3/uL (ref 1.5–6.5)
PLATELETS: 128 10*3/uL — AB (ref 140–400)
RBC: 5.01 10*6/uL (ref 4.20–5.82)
RDW: 12.7 % (ref 11.0–14.6)
WBC: 5.9 10*3/uL (ref 4.0–10.3)
lymph#: 1 10*3/uL (ref 0.9–3.3)

## 2016-01-10 NOTE — Telephone Encounter (Signed)
Gave and rpitned appt sched and avs for pt for Jan 2018 and July 2018

## 2016-01-10 NOTE — Progress Notes (Signed)
  [place] OFFICE PROGRESS NOTE   Diagnosis: Colon cancer  INTERVAL HISTORY:   [name] returns as scheduled. He reports intentional weight loss with a change in his diet and exercise. No symptom of recurrent thrombosis. He feels well. He has noted enlargement of a mole at the right lower back. He request we check a screening PSA. He denies urinary symptoms.  Objective:  Vital signs in last 24 hours:  Blood pressure 137/74, pulse 61, temperature 98.5 F (36.9 C), temperature source Oral, resp. rate 20, height '6\' 1"'$  (1.854 m), weight 198 lb 4.8 oz (89.948 kg), SpO2 99 %.    HEENT: Neck without mass Lymphatics: No cervical, supraclavicular, axillary, or inguinal nodes Resp: Lungs clear bilaterally Cardio: Regular rate and rhythm GI: No hepatomegaly, no mass, nontender Vascular: No leg edema  Skin: 0.5 cm round dark pink mole at the right lower back, the mole is regular and slightly raised     Lab Results:  Lab Results  Component Value Date   WBC 5.9 01/10/2016   HGB 15.0 01/10/2016   HCT 43.2 01/10/2016   MCV 86.2 01/10/2016   PLT 128* 01/10/2016   NEUTROABS 3.7 01/10/2016    Medications: I have reviewed the patient's current medications.  Assessment/Plan: 1. 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 Initiation of adjuvant Xeloda chemotherapy 06/27/2010. He completed the eighth and final cycle of Xeloda beginning 11/22/2010.   Surveillance colonoscopy 06/24/2011-3 polyps were removed (tubular and tubulovillous adenomas)  Surveillance colonoscopy 08/23/2013-tubular adenoma removed from the cecum 2. History of iron deficiency anemia. The hemoglobin was in normal range on outside labs done 06/05/2011. The ferritin was low at 20. The hemoglobin is normal. 3. 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  4. Testing for microsatellite  instability revealed a microsatellite stable genotype.  5. Hand-foot syndrome secondary to Xeloda, improved with a dose reduction to 1500 mg twice daily beginning with cycle #4 on 08/30/2010. Resolved.  6. History of an elevated bilirubin and liver enzymes, ? toxicity related to Xeloda.  7. Acute onset of chest pain 10/28/2010. A CT of the chest confirmed multiple small emboli at the branches of the right lower lobe and probably left lower lobe pulmonary arteries. He was maintained on Lovenox anticoagulation until he was started on Coumadin following an office visit 12/09/2010. He was evaluated by Dr. Joan Flores at [facility] . Coumadin was discontinued in December of 2012. 8. Hypercoagulation panel 12/09/2010 revealed a mild decrease in the protein S and protein C activity of unclear significance. The factor V Leiden and prothrombin gene mutations were negative. A lupus anticoagulant screen was negative. The beta-2 glycoprotein IgM was elevated. Repeat laboratory evaluation for antiphospholipid syndrome on 12/15/2011-negative lupus anticoagulant, normal d-dimer, stable mildly elevated beta-2 glycoprotein IgM 1. D-dimer at [facility] on 06/05/2011 was in normal range. Normal d-dimer 07/28/2011 and 12/15/2011. Coumadin was discontinued in December of 2012.  9. history of Mild thrombocytopenia     Disposition:  He remains in clinical remission from colon cancer. He would like to continue follow-up at the [facility]. He will return for a CEA in 6 months and an office visit in one year. He request to be obtained a screening PSA today. The mole at the right lower back appears benign.  He will contact Dr. [name] to schedule a follow-up colonoscopy.  Betsy Coder, MD  01/10/2016  11:52 AM

## 2016-01-11 ENCOUNTER — Telehealth: Payer: Self-pay | Admitting: *Deleted

## 2016-01-11 LAB — PSA: PROSTATE SPECIFIC AG, SERUM: 0.6 ng/mL (ref 0.0–4.0)

## 2016-01-11 LAB — CEA (PARALLEL TESTING): CEA: 2.1 ng/mL

## 2016-01-11 NOTE — Telephone Encounter (Signed)
-----   Message from [name], MD sent at 01/11/2016  8:42 AM EDT ----- Please call patient, Brian Cantrell and Brian Cantrell are normal

## 2016-01-11 NOTE — Telephone Encounter (Signed)
Called pt with normal CEA and PSA result. He voiced appreciation for call.

## 2016-07-17 ENCOUNTER — Other Ambulatory Visit (HOSPITAL_BASED_OUTPATIENT_CLINIC_OR_DEPARTMENT_OTHER): Payer: Managed Care, Other (non HMO)

## 2016-07-17 DIAGNOSIS — C187 Malignant neoplasm of sigmoid colon: Secondary | ICD-10-CM

## 2016-07-17 DIAGNOSIS — Z85038 Personal history of other malignant neoplasm of large intestine: Secondary | ICD-10-CM | POA: Diagnosis not present

## 2016-07-17 LAB — CEA (IN HOUSE-CHCC): CEA (CHCC-IN HOUSE): 2.88 ng/mL (ref 0.00–5.00)

## 2016-07-18 ENCOUNTER — Telehealth: Payer: Self-pay | Admitting: *Deleted

## 2016-07-18 LAB — CEA (PARALLEL TESTING): CEA: 1.8 ng/mL

## 2016-07-18 NOTE — Telephone Encounter (Signed)
-----   Message from [name], MD sent at 07/18/2016  3:42 PM EST ----- Please call patient, Brian Cantrell is normal

## 2016-07-18 NOTE — Telephone Encounter (Signed)
Pt notified of normal CEA result. He voiced appreciation for call.

## 2016-10-09 ENCOUNTER — Encounter: Payer: Self-pay | Admitting: Internal Medicine

## 2016-10-14 ENCOUNTER — Ambulatory Visit (AMBULATORY_SURGERY_CENTER): Payer: Self-pay | Admitting: *Deleted

## 2016-10-14 VITALS — Ht 73.0 in | Wt 194.0 lb

## 2016-10-14 DIAGNOSIS — Z85038 Personal history of other malignant neoplasm of large intestine: Secondary | ICD-10-CM

## 2016-10-14 MED ORDER — NA SULFATE-K SULFATE-MG SULF 17.5-3.13-1.6 GM/177ML PO SOLN: 1.0000 | Freq: Once | ORAL | 0 refills | Status: AC

## 2016-10-14 NOTE — Progress Notes (Signed)
No egg or soy allergy known to patient  No issues with past sedation with any surgeries  or procedures, no intubation problems  No diet pills per patient No home 02 use per patient  No blood thinners per patient  Pt denies issues with constipation  No A fib or A flutter  emmi video to e mail    

## 2016-10-15 ENCOUNTER — Encounter: Payer: Self-pay | Admitting: Internal Medicine

## 2016-10-21 ENCOUNTER — Ambulatory Visit (AMBULATORY_SURGERY_CENTER): Payer: Managed Care, Other (non HMO) | Admitting: Internal Medicine

## 2016-10-21 ENCOUNTER — Encounter: Payer: Self-pay | Admitting: Internal Medicine

## 2016-10-21 VITALS — BP 117/77 | HR 58 | Temp 98.0°F | Resp 12 | Ht 73.0 in | Wt 194.0 lb

## 2016-10-21 DIAGNOSIS — Z86010 Personal history of colonic polyps: Secondary | ICD-10-CM

## 2016-10-21 DIAGNOSIS — Z85038 Personal history of other malignant neoplasm of large intestine: Secondary | ICD-10-CM | POA: Diagnosis present

## 2016-10-21 DIAGNOSIS — D122 Benign neoplasm of ascending colon: Secondary | ICD-10-CM | POA: Diagnosis not present

## 2016-10-21 DIAGNOSIS — K635 Polyp of colon: Secondary | ICD-10-CM | POA: Diagnosis not present

## 2016-10-21 HISTORY — PX: COLONOSCOPY: SHX174

## 2016-10-21 MED ORDER — SODIUM CHLORIDE 0.9 % IV SOLN: 500.0000 mL | INTRAVENOUS | Status: DC

## 2016-10-21 NOTE — Patient Instructions (Signed)
YOU HAD AN ENDOSCOPIC PROCEDURE TODAY AT [facility]:   Refer to the procedure report that was given to you for any specific questions about what was found during the examination.  If the procedure report does not answer your questions, please call your gastroenterologist to clarify.  If you requested that your care partner not be given the details of your procedure findings, then the procedure report has been included in a sealed envelope for you to review at your convenience later.  YOU SHOULD EXPECT: Some feelings of bloating in the abdomen. Passage of more gas than usual.  Walking can help get rid of the air that was put into your GI tract during the procedure and reduce the bloating. If you had a lower endoscopy (such as a colonoscopy or flexible sigmoidoscopy) you may notice spotting of blood in your stool or on the toilet paper. If you underwent a bowel prep for your procedure, you may not have a normal bowel movement for a few days.  Please Note:  You might notice some irritation and congestion in your nose or some drainage.  This is from the oxygen used during your procedure.  There is no need for concern and it should clear up in a day or so.  SYMPTOMS TO REPORT IMMEDIATELY:   Following lower endoscopy (colonoscopy or flexible sigmoidoscopy):  Excessive amounts of blood in the stool  Significant tenderness or worsening of abdominal pains  Swelling of the abdomen that is new, acute  Fever of 100F or higher   For urgent or emergent issues, a gastroenterologist can be reached at any hour by calling [phone].   DIET:  We do recommend a small meal at first, but then you may proceed to your regular diet.  Drink plenty of fluids but you should avoid alcoholic beverages for 24 hours.  ACTIVITY:  You should plan to take it easy for the rest of today and you should NOT DRIVE or use heavy machinery until tomorrow (because of the sedation medicines used during the test).     FOLLOW UP: Our staff will call the number listed on your records the next business day following your procedure to check on you and address any questions or concerns that you may have regarding the information given to you following your procedure. If we do not reach you, we will leave a message.  However, if you are feeling well and you are not experiencing any problems, there is no need to return our call.  We will assume that you have returned to your regular daily activities without incident.  If any biopsies were taken you will be contacted by phone or by letter within the next 1-3 weeks.  Please call us at [phone] if you have not heard about the biopsies in 3 weeks.   Await for biopsy results Repeat Colonoscopy in 3 years Hemorrhoids (handout given) Polyps (handout given) High Fiber Diet (handout given)  SIGNATURES/CONFIDENTIALITY: You and/or your care partner have signed paperwork which will be entered into your electronic medical record.  These signatures attest to the fact that that the information above on your After Visit Summary has been reviewed and is understood.  Full responsibility of the confidentiality of this discharge information lies with you and/or your care-partner.

## 2016-10-21 NOTE — Op Note (Signed)
[facility] Patient Name: Brian Cantrell Procedure Date: 10/21/2016 9:29 AM MRN: [id number] Endoscopist: Docia Chuck. [name] , MD Age: 51 Referring MD:  Date of Birth: 09/24/1965 Gender: Male Account #: [id number] Procedure:                Colonoscopy, with cold snare polypectomy x 1 Indications:              High risk colon cancer surveillance: Personal                            history of adenoma (10 mm or greater in size), High                            risk colon cancer surveillance: Personal history of                            multiple (3 or more) adenomas, High risk colon                            cancer surveillance: Personal history of colon                            cancer diagnosed October 2011 (obstructing sigmoid                            lesion with consequential incomplete exam).                            Subsequent colonoscopy December 2012 with multiple                            advanced adenomas. Last exam 2015 with one adenoma Medicines:                Monitored Anesthesia Care Procedure:                Pre-Anesthesia Assessment:                           - Prior to the procedure, a History and Physical                            was performed, and patient medications and                            allergies were reviewed. The patient's tolerance of                            previous anesthesia was also reviewed. The risks                            and benefits of the procedure and the sedation                            options and risks were discussed with the patient.  All questions were answered, and informed consent                            was obtained. Prior Anticoagulants: The patient has                            taken no previous anticoagulant or antiplatelet                            agents. ASA Grade Assessment: II - A patient with                            mild systemic disease. After reviewing the risks                         and benefits, the patient was deemed in                            satisfactory condition to undergo the procedure.                           After obtaining informed consent, the colonoscope                            was passed under direct vision. Throughout the                            procedure, the patient's blood pressure, pulse, and                            oxygen saturations were monitored continuously. The                            Colonoscope was introduced through the anus and                            advanced to the the cecum, identified by                            appendiceal orifice and ileocecal valve. The                            ileocecal valve, appendiceal orifice, and rectum                            were photographed. The quality of the bowel                            preparation was excellent. The colonoscopy was                            performed without difficulty. The patient tolerated  the procedure well. The bowel preparation used was                            SUPREP. Scope In: 9:36:57 AM Scope Out: 9:51:34 AM Scope Withdrawal Time: 0 hours 13 minutes 26 seconds  Total Procedure Duration: 0 hours 14 minutes 37 seconds  Findings:                 A healthy-appearing anastomosis was found in the                            sigmoid colon at 27 cm from the anal verge. A 5 mm                            polyp was found in the ascending colon. The polyp                            was sessile. The polyp was removed with a cold                            snare. Resection and retrieval were complete.                           Internal hemorrhoids were found during                            retroflexion. The hemorrhoids were small.                           The exam was otherwise without abnormality on                            direct and retroflexion views. Complications:            No immediate  complications. Estimated blood loss:                            None. Estimated Blood Loss:     Estimated blood loss: none. Impression:               - One 5 mm polyp in the ascending colon, removed                            with a cold snare. Resected and retrieved.                           - Internal hemorrhoids.                           - Status post sigmoid colectomy with healthy                            anastomosis. The examination was otherwise normal                            on direct  and retroflexion views. Recommendation:           - Repeat colonoscopy in 3 years for surveillance.                           - Patient has a contact number available for                            emergencies. The signs and symptoms of potential                            delayed complications were discussed with the                            patient. Return to normal activities tomorrow.                            Written discharge instructions were provided to the                            patient.                           - Resume previous diet.                           - Continue present medications.                           - Await pathology results. Docia Chuck. [name], MD 10/21/2016 10:01:37 AM This report has been signed electronically.

## 2016-10-21 NOTE — Progress Notes (Signed)
Spontaneous respirations throughout. VSS. Resting comfortably. To PACU on room air. Report to  Michelle RN. 

## 2016-10-21 NOTE — Progress Notes (Signed)
Pt's states no medical or surgical changes since previsit or office visit. 

## 2016-10-21 NOTE — Progress Notes (Signed)
Called to room to assist during endoscopic procedure.  Patient ID and intended procedure confirmed with present staff. Received instructions for my participation in the procedure from the performing physician.  

## 2016-10-22 ENCOUNTER — Telehealth: Payer: Self-pay

## 2016-10-22 NOTE — Telephone Encounter (Signed)
  Follow up Call-  Call back number 10/21/2016  Post procedure Call Back phone  # [phone]  Permission to leave phone message Yes  Some recent data might be hidden    Patient expressed how happy he was with the care that he received while in the endoscopy center.   Patient questions:  Do you have a fever, pain , or abdominal swelling? No. Pain Score  0 *  Have you tolerated food without any problems? Yes.    Have you been able to return to your normal activities? Yes.    Do you have any questions about your discharge instructions: Diet   No. Medications  No. Follow up visit  No.  Do you have questions or concerns about your Care? No.  Actions: * If pain score is 4 or above: No action needed, pain <4.

## 2016-10-22 NOTE — Telephone Encounter (Signed)
Phone lines are down. 

## 2016-10-24 ENCOUNTER — Encounter: Payer: Self-pay | Admitting: Internal Medicine

## 2017-01-15 ENCOUNTER — Other Ambulatory Visit: Payer: Managed Care, Other (non HMO)

## 2017-01-15 ENCOUNTER — Ambulatory Visit: Payer: Managed Care, Other (non HMO) | Admitting: Oncology

## 2017-01-15 ENCOUNTER — Other Ambulatory Visit: Payer: Self-pay | Admitting: *Deleted

## 2017-01-15 DIAGNOSIS — C187 Malignant neoplasm of sigmoid colon: Secondary | ICD-10-CM

## 2017-03-10 ENCOUNTER — Telehealth: Payer: Self-pay | Admitting: Oncology

## 2017-03-10 ENCOUNTER — Ambulatory Visit (HOSPITAL_BASED_OUTPATIENT_CLINIC_OR_DEPARTMENT_OTHER): Payer: Managed Care, Other (non HMO) | Admitting: Oncology

## 2017-03-10 ENCOUNTER — Other Ambulatory Visit (HOSPITAL_BASED_OUTPATIENT_CLINIC_OR_DEPARTMENT_OTHER): Payer: Managed Care, Other (non HMO)

## 2017-03-10 VITALS — BP 133/69 | HR 57 | Temp 98.6°F | Resp 20 | Ht 73.0 in | Wt 190.2 lb

## 2017-03-10 DIAGNOSIS — Z85038 Personal history of other malignant neoplasm of large intestine: Secondary | ICD-10-CM

## 2017-03-10 DIAGNOSIS — C187 Malignant neoplasm of sigmoid colon: Secondary | ICD-10-CM

## 2017-03-10 LAB — CBC WITH DIFFERENTIAL/PLATELET
BASO%: 0.2 % (ref 0.0–2.0)
Basophils Absolute: 0 10*3/uL (ref 0.0–0.1)
EOS ABS: 0.1 10*3/uL (ref 0.0–0.5)
EOS%: 2.2 % (ref 0.0–7.0)
HEMATOCRIT: 46.7 % (ref 38.4–49.9)
HGB: 16.1 g/dL (ref 13.0–17.1)
LYMPH%: 24.8 % (ref 14.0–49.0)
MCH: 30.8 pg (ref 27.2–33.4)
MCHC: 34.5 g/dL (ref 32.0–36.0)
MCV: 89.3 fL (ref 79.3–98.0)
MONO#: 0.3 10*3/uL (ref 0.1–0.9)
MONO%: 5.9 % (ref 0.0–14.0)
NEUT#: 3.3 10*3/uL (ref 1.5–6.5)
NEUT%: 66.9 % (ref 39.0–75.0)
PLATELETS: 144 10*3/uL (ref 140–400)
RBC: 5.23 10*6/uL (ref 4.20–5.82)
RDW: 12.5 % (ref 11.0–14.6)
WBC: 5 10*3/uL (ref 4.0–10.3)
lymph#: 1.2 10*3/uL (ref 0.9–3.3)
nRBC: 0 % (ref 0–0)

## 2017-03-10 LAB — CEA (IN HOUSE-CHCC): CEA (CHCC-In House): 3.06 ng/mL (ref 0.00–5.00)

## 2017-03-10 NOTE — Progress Notes (Signed)
[facility] OFFICE PROGRESS NOTE   Diagnosis: Colon cancer  INTERVAL HISTORY:   Brian Cantrell returns for a scheduled visit. He feels well. Good appetite and energy level. He is running for exercise. No difficulty with bowel function. He reports intentional weight loss. He underwent a colonoscopy 10/21/2016. A 5 mm polyp was removed from the ascending colon. The pathology revealed a sessile ulcerated polyp without dysplasia.  He has occasional discomfort in the left lower leg, no consistent swelling or pain. No symptom of recurrent pulmonary embolism.  Objective:  Vital signs in last 24 hours:  Blood pressure 133/69, pulse (!) 57, temperature 98.6 F (37 C), temperature source Oral, resp. rate 20, height 6\' 1"  (1.854 m), weight 190 lb 3.2 oz (86.3 kg), SpO2 100 %.    HEENT: Neck without mass Lymphatics: No cervical, supraclavicular, axillary, or inguinal nodes Resp: Lungs clear bilaterally Cardio: Regular rate and rhythm GI: No hepatosplenomegaly, no mass, nontender Vascular: No leg edema   Lab Results:  Lab Results  Component Value Date   WBC 5.0 03/10/2017   HGB 16.1 03/10/2017   HCT 46.7 03/10/2017   MCV 89.3 03/10/2017   PLT 144 03/10/2017   NEUTROABS 3.3 03/10/2017     Lab Results  Component Value Date   CEA1 2.88 07/17/2016     Medications: I have reviewed the patient's current medications.  Assessment/Plan: 1. 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 Initiation of adjuvant Xeloda chemotherapy 06/27/2010. He completed the eighth and final cycle of Xeloda beginning 11/22/2010.   Surveillance colonoscopy 06/24/2011-3 polyps were removed (tubular and tubulovillous adenomas)  Surveillance colonoscopy 08/23/2013-tubular adenoma removed from the cecum  Surveillance colonoscopy 10/21/2016-benign polyp removed from the ascending colon 2. History of iron deficiency  anemia. The hemoglobin was in normal range on outside labs done 06/05/2011. The ferritin was low at 20. The hemoglobin is normal. 3. 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  4. 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  5. Hand-foot syndrome secondary to Xeloda, improved with a dose reduction to 1500 mg twice daily beginning with cycle #4 on 08/30/2010. Resolved.  6. History of an elevated bilirubin and liver enzymes, ? toxicity related to Xeloda.  7. Acute onset of chest pain 10/28/2010. A CT of the chest confirmed multiple small emboli at the branches of the right lower lobe and probably left lower lobe pulmonary arteries. He was maintained on Lovenox anticoagulation until he was started on Coumadin following an office visit 12/09/2010. He was evaluated by Dr. 02/08/2011 at [facility] . Coumadin was discontinued in December of 2012. 8. Hypercoagulation panel 12/09/2010 revealed a mild decrease in the protein S and protein C activity of unclear significance. The factor V Leiden and prothrombin gene mutations were negative. A lupus anticoagulant screen was negative. The beta-2 glycoprotein IgM was elevated. Repeat laboratory evaluation for antiphospholipid syndrome on 12/15/2011-negative lupus anticoagulant, normal d-dimer, stable mildly elevated beta-2 glycoprotein IgM 1. D-dimer at [facility] on 06/05/2011 was in normal range. Normal d-dimer 07/28/2011 and 12/15/2011. Coumadin was discontinued in December of 2012.  9. history of Mild thrombocytopenia     Disposition:  Brian Cantrell remains in clinical remission from colon cancer. He has a good prognosis for a long-term disease-free survival. He will continue surveillance colonoscopies with Dr. Cherlyn Cushing. He plans to continue clinical follow-up with Dr. Marina Goodell.  Brian Cantrell will seek medical attention for symptoms  of recurrent venous thromboembolic disease.  He is not scheduled for a  follow-up appointment at the [facility]. I am available to see him in the future as needed.  Donneta Romberg, MD  03/10/2017  12:56 PM

## 2017-03-10 NOTE — Telephone Encounter (Signed)
Follow up TBA per 9/4 los.

## 2017-03-11 ENCOUNTER — Telehealth: Payer: Self-pay | Admitting: *Deleted

## 2017-03-11 LAB — CEA (PARALLEL TESTING): CEA: 2.3 ng/mL

## 2017-03-11 NOTE — Telephone Encounter (Signed)
-----   Message from [name], MD sent at 03/11/2017  3:41 PM EDT ----- Please call patient, cea is normal

## 2018-04-16 ENCOUNTER — Encounter: Payer: Self-pay | Admitting: Family Medicine

## 2018-04-16 ENCOUNTER — Ambulatory Visit: Payer: 59 | Admitting: Family Medicine

## 2018-04-16 VITALS — BP 128/80 | HR 67 | Temp 98.3°F | Ht 73.5 in | Wt 202.2 lb

## 2018-04-16 DIAGNOSIS — L989 Disorder of the skin and subcutaneous tissue, unspecified: Secondary | ICD-10-CM | POA: Diagnosis not present

## 2018-04-16 DIAGNOSIS — Z85038 Personal history of other malignant neoplasm of large intestine: Secondary | ICD-10-CM

## 2018-04-16 DIAGNOSIS — M79604 Pain in right leg: Secondary | ICD-10-CM

## 2018-04-16 DIAGNOSIS — Z0000 Encounter for general adult medical examination without abnormal findings: Secondary | ICD-10-CM | POA: Diagnosis not present

## 2018-04-16 NOTE — Progress Notes (Signed)
   Subjective:    Patient ID: Brian Cantrell, male    DOB: 07-09-1965, 52 y.o.   MRN: [id number]  HPI Here for a well exam. We have not seen him in over 3 years. He has a few items to discuss. First he asks about pain in the right leg. No hx of trauma, but it started to hurt both above and below the knee about 6 months ago. No swelling. He is a runner but he stopped running in April because of this pain. He thinks it may be an IT band issue. Also he as some skin irritations and he wants to see Dermatology again. He has a hx of colon cancer and is S/P a sigmoid colectomy. He stopped seeing Oncology some years ago but was continuing to be screened with CEA levels. The last one of these was checked one year ago.    Review of Systems  Constitutional: Negative.   HENT: Negative.   Eyes: Negative.   Respiratory: Negative.   Cardiovascular: Negative.   Gastrointestinal: Negative.   Genitourinary: Negative.   Musculoskeletal: Positive for myalgias.  Skin: Negative.   Neurological: Negative.   Psychiatric/Behavioral: Negative.        Objective:   Physical Exam  Constitutional: He is oriented to person, place, and time. He appears well-developed and well-nourished. No distress.  HENT:  Head: Normocephalic and atraumatic.  Right Ear: External ear normal.  Left Ear: External ear normal.  Nose: Nose normal.  Mouth/Throat: Oropharynx is clear and moist. No oropharyngeal exudate.  Eyes: Pupils are equal, round, and reactive to light. Conjunctivae and EOM are normal. Right eye exhibits no discharge. Left eye exhibits no discharge. No scleral icterus.  Neck: Neck supple. No JVD present. No tracheal deviation present. No thyromegaly present.  Cardiovascular: Normal rate, regular rhythm, normal heart sounds and intact distal pulses. Exam reveals no gallop and no friction rub.  No murmur heard. Pulmonary/Chest: Effort normal and breath sounds normal. No respiratory distress. He has no wheezes. He  has no rales. He exhibits no tenderness.  Abdominal: Soft. Bowel sounds are normal. He exhibits no distension and no mass. There is no tenderness. There is no rebound and no guarding.  Genitourinary: Rectum normal, prostate normal and penis normal. Rectal exam shows guaiac negative stool. No penile tenderness.  Musculoskeletal: Normal range of motion. He exhibits no edema or tenderness.  Lymphadenopathy:    He has no cervical adenopathy.  Neurological: He is alert and oriented to person, place, and time. He has normal reflexes. He displays normal reflexes. No cranial nerve deficit. He exhibits normal muscle tone. Coordination normal.  Skin: Skin is warm and dry. No rash noted. He is not diaphoretic. No erythema. No pallor.  Psychiatric: He has a normal mood and affect. His behavior is normal. Judgment and thought content normal.          Assessment & Plan:  Well exam. We discussed diet and exercise. We will refer him to Sports Medicine about the leg pain, since he wants to resume running at some point. Refer to Dermatology for a skin check. Set up fasting labs for next week, and we will add a CEA to these.  Alysia Penna, MD

## 2018-04-19 ENCOUNTER — Other Ambulatory Visit (INDEPENDENT_AMBULATORY_CARE_PROVIDER_SITE_OTHER): Payer: 59

## 2018-04-19 DIAGNOSIS — Z0000 Encounter for general adult medical examination without abnormal findings: Secondary | ICD-10-CM | POA: Diagnosis not present

## 2018-04-19 DIAGNOSIS — Z85038 Personal history of other malignant neoplasm of large intestine: Secondary | ICD-10-CM

## 2018-04-19 LAB — CBC WITH DIFFERENTIAL/PLATELET
BASOS ABS: 0 10*3/uL (ref 0.0–0.1)
Basophils Relative: 0.5 % (ref 0.0–3.0)
Eosinophils Absolute: 0.3 10*3/uL (ref 0.0–0.7)
Eosinophils Relative: 4.8 % (ref 0.0–5.0)
HEMATOCRIT: 46.9 % (ref 39.0–52.0)
HEMOGLOBIN: 16.1 g/dL (ref 13.0–17.0)
LYMPHS PCT: 23.6 % (ref 12.0–46.0)
Lymphs Abs: 1.3 10*3/uL (ref 0.7–4.0)
MCHC: 34.4 g/dL (ref 30.0–36.0)
MCV: 88.7 fl (ref 78.0–100.0)
MONOS PCT: 6.7 % (ref 3.0–12.0)
Monocytes Absolute: 0.4 10*3/uL (ref 0.1–1.0)
NEUTROS ABS: 3.5 10*3/uL (ref 1.4–7.7)
Neutrophils Relative %: 64.4 % (ref 43.0–77.0)
Platelets: 127 10*3/uL — ABNORMAL LOW (ref 150.0–400.0)
RBC: 5.28 Mil/uL (ref 4.22–5.81)
RDW: 12.8 % (ref 11.5–15.5)
WBC: 5.4 10*3/uL (ref 4.0–10.5)

## 2018-04-19 LAB — LIPID PANEL
CHOL/HDL RATIO: 3
Cholesterol: 184 mg/dL (ref 0–200)
HDL: 53.4 mg/dL (ref >39.00)
LDL CALC: 108 mg/dL — AB (ref 0–99)
NonHDL: 130.47
TRIGLYCERIDES: 111 mg/dL (ref 0.0–149.0)
VLDL: 22.2 mg/dL (ref 0.0–40.0)

## 2018-04-19 LAB — BASIC METABOLIC PANEL
BUN: 17 mg/dL (ref 6–23)
CALCIUM: 9.1 mg/dL (ref 8.4–10.5)
CO2: 31 mEq/L (ref 19–32)
Chloride: 104 mEq/L (ref 96–112)
Creatinine, Ser: 0.77 mg/dL (ref 0.40–1.50)
GFR: 111.63 mL/min (ref >60.00)
Glucose, Bld: 91 mg/dL (ref 70–99)
POTASSIUM: 4.4 meq/L (ref 3.5–5.1)
SODIUM: 139 meq/L (ref 135–145)

## 2018-04-19 LAB — HEPATIC FUNCTION PANEL
ALK PHOS: 66 U/L (ref 39–117)
ALT: 16 U/L (ref 0–53)
AST: 15 U/L (ref 0–37)
Albumin: 4.2 g/dL (ref 3.5–5.2)
Bilirubin, Direct: 0.1 mg/dL (ref 0.0–0.3)
Total Bilirubin: 1 mg/dL (ref 0.2–1.2)
Total Protein: 6.7 g/dL (ref 6.0–8.3)

## 2018-04-19 LAB — TSH: TSH: 1.12 u[IU]/mL (ref 0.35–4.50)

## 2018-04-19 LAB — PSA: PSA: 0.63 ng/mL (ref 0.10–4.00)

## 2018-04-21 LAB — CEA: CEA: 1.8 ng/mL

## 2018-04-26 ENCOUNTER — Encounter (INDEPENDENT_AMBULATORY_CARE_PROVIDER_SITE_OTHER): Payer: Self-pay | Admitting: Orthopedic Surgery

## 2018-04-26 ENCOUNTER — Ambulatory Visit (INDEPENDENT_AMBULATORY_CARE_PROVIDER_SITE_OTHER): Payer: 59

## 2018-04-26 ENCOUNTER — Ambulatory Visit (INDEPENDENT_AMBULATORY_CARE_PROVIDER_SITE_OTHER): Payer: 59 | Admitting: Orthopedic Surgery

## 2018-04-26 VITALS — Ht 73.5 in | Wt 202.2 lb

## 2018-04-26 DIAGNOSIS — M5431 Sciatica, right side: Secondary | ICD-10-CM

## 2018-04-26 DIAGNOSIS — M25561 Pain in right knee: Secondary | ICD-10-CM | POA: Diagnosis not present

## 2018-04-27 ENCOUNTER — Encounter (INDEPENDENT_AMBULATORY_CARE_PROVIDER_SITE_OTHER): Payer: Self-pay | Admitting: Orthopedic Surgery

## 2018-04-27 NOTE — Progress Notes (Signed)
Office Visit Note   Patient: Brian Cantrell           Date of Birth: [birth year]           MRN: [id number] Visit Date: 04/26/2018              Requested by: Laurey Morale, MD [place],  [id number] PCP: Laurey Morale, MD  Chief Complaint  Patient presents with  . Right Leg - Pain  . Left Leg - Pain      HPI: Patient is a 52 year old gentleman who was seen for initial evaluation for bilateral knee pain which she states is been present since March.  He states he could not climb stairs in March.  Patient also complains of some pain in the right buttocks with pain going down the lateral aspect of the right leg which she describes as iliotibial band syndrome.  Patient has tried anti-inflammatories he states he has been symptomatic for greater than 6 months.  He does exercising which he states does help a little.  Pain primarily in the lateral aspect of the right knee.  He states he has had symptoms for over 6 months.  Patient describes the gluteal pain is radiating down the lateral aspect of the right thigh to the lateral aspect of the right knee described as a throbbing aching pain.  Patient describes a history of running for over 6 years.  Assessment & Plan: Visit Diagnoses:  1. Sciatica, right side   2. Acute pain of right knee     Plan: Patient is tried anti-inflammatories therapy and conservative treatment for over 6 weeks.  He states he cannot take prednisone due to bad reaction in the past we will set him up for an MRI scan and follow-up after the MRI is obtained he may benefit from an epidural steroid injection.  Follow-Up Instructions: Return in about 2 weeks (around 05/10/2018).   Ortho Exam  Patient is alert, oriented, no adenopathy, well-dressed, normal affect, normal respiratory effort. Examination patient has a normal gait he can walk on his heels and toes with no weakness he has a negative straight leg raise.  He has no pain with range of motion  of the hip knee or ankle there is no knee effusion the collateral cruciate ligaments are stable to the right knee there is no tenderness to palpation around the knee.  Patient does have sciatic symptoms down the right lower extremity which is worse with sitting.  Imaging: Xr Knee 1-2 Views Right  Result Date: 04/27/2018 2 view radiographs of the right knee shows no bony abnormalities no joint space narrowing no bony spurs no cysts or sclerosis.  Xr Lumbar Spine 2-3 Views  Result Date: 04/27/2018 2 view radiographs of the lumbar spine shows joint space narrowing lower lumbar spine with no spondylolisthesis.  No images are attached to the encounter.  Labs: No results found for: HGBA1C, ESRSEDRATE, CRP, LABURIC, REPTSTATUS, GRAMSTAIN, CULT, LABORGA   Lab Results  Component Value Date   ALBUMIN 4.2 04/19/2018   ALBUMIN 4.2 11/18/2010   ALBUMIN 4.6 10/28/2010    Body mass index is 26.31 kg/m.  Orders:  Orders Placed This Encounter  Procedures  . XR Lumbar Spine 2-3 Views  . XR Knee 1-2 Views Right  . MR Lumbar Spine w/o contrast   No orders of the defined types were placed in this encounter.    Procedures: No procedures performed  Clinical Data: No additional findings.  ROS:  All other systems negative, except as noted in the HPI. Review of Systems  Objective: Vital Signs: Ht 6' 1.5" (1.867 m)   Wt 202 lb 3 oz (91.7 kg)   BMI 26.31 kg/m   Specialty Comments:  No specialty comments available.  PMFS History: Patient Active Problem List   Diagnosis Date Noted  . Hx of colon cancer, stage II 04/16/2018  . Eczema 12/25/2014  . ADHD (attention deficit hyperactivity disorder) 12/25/2014  . Pulmonary embolism ([place]) 05/08/2011  . NONSPECIFIC ABN FINDING RAD & OTH EXAM GI TRACT 04/16/2010  . ANEMIA, IRON DEFICIENCY 04/03/2010  . ABDOMINAL PAIN, LEFT UPPER QUADRANT 04/02/2010  . ABDOMINAL PAIN -GENERALIZED 04/02/2010  . NONSPECIFIC ABNORMAL FINDING IN STOOL  CONTENTS 04/02/2010  . ABDOMINAL PAIN 02/27/2010  . ACUTE BRONCHITIS 06/09/2008  . 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 ASTHMA 02/08/2008   Past Medical History:  Diagnosis Date  . Anemia    related to colon cancer   . Colon cancer [facility]) October 2011   sees Dr. Benay Spice   . Pulmonary embolism [facility]) April 2012    Family History  Problem Relation Age of Onset  . Coronary artery disease Unknown   . Heart disease Father   . Lung cancer Maternal Uncle   . Colon cancer Neg Hx   . Esophageal cancer Neg Hx   . Stomach cancer Neg Hx   . Pancreatic cancer Neg Hx   . Colon polyps Neg Hx     Past Surgical History:  Procedure Laterality Date  . COLON RESECTION  05/2010   sigmoid colectomy per Dr. Excell Seltzer   . COLON SURGERY    . COLONOSCOPY  10/21/2016   per Dr. [name], sessile serrated polyps, repeat in 3 yrs   . POLYPECTOMY    . WISDOM TOOTH EXTRACTION     Social History   Occupational History  . Not on file  Tobacco Use  . Smoking status: Never Smoker  . Smokeless tobacco: Never Used  Substance and Sexual Activity  . Alcohol use: Yes    Alcohol/week: 3.0 standard drinks    Types: 3 Cans of beer per week    Comment: on weekends  . Drug use: No  . Sexual activity: Yes

## 2018-11-03 ENCOUNTER — Encounter: Payer: Self-pay | Admitting: Family Medicine

## 2018-11-03 ENCOUNTER — Other Ambulatory Visit: Payer: Self-pay

## 2018-11-03 ENCOUNTER — Ambulatory Visit (INDEPENDENT_AMBULATORY_CARE_PROVIDER_SITE_OTHER): Payer: 59 | Admitting: Family Medicine

## 2018-11-03 DIAGNOSIS — F900 Attention-deficit hyperactivity disorder, predominantly inattentive type: Secondary | ICD-10-CM

## 2018-11-03 MED ORDER — LISDEXAMFETAMINE DIMESYLATE 10 MG PO CAPS: 10.0000 mg | ORAL_CAPSULE | Freq: Every day | ORAL | 0 refills | Status: DC

## 2018-11-03 NOTE — Progress Notes (Signed)
Subjective:    Patient ID: Brian Cantrell, male    DOB: May 27, 1966, 53 y.o.   MRN: [id number]  HPI Virtual Visit via Video Note  I connected with the patient on 11/03/18 at  3:15 PM EDT by a video enabled telemedicine application and verified that I am speaking with the correct person using two identifiers.  Location patient: home Location provider:work or home office Persons participating in the virtual visit: patient, provider  I discussed the limitations of evaluation and management by telemedicine and the availability of in person appointments. The patient expressed understanding and agreed to proceed.   HPI: Here to follow up on ADHD. He is doing well in general. He takes Vyvanse about once a week and it helps him focus tremendously. However he usually has trouble sleeping that night. He asks if he can try the 10 mg dose instead.    ROS: See pertinent positives and negatives per HPI.  Past Medical History:  Diagnosis Date  . Anemia    related to colon cancer   . Colon cancer [facility]) October 2011   sees Dr. Benay Spice   . Pulmonary embolism [facility]) April 2012    Past Surgical History:  Procedure Laterality Date  . COLON RESECTION  05/2010   sigmoid colectomy per Dr. Excell Seltzer   . COLON SURGERY    . COLONOSCOPY  10/21/2016   per Dr. [name], sessile serrated polyps, repeat in 3 yrs   . POLYPECTOMY    . WISDOM TOOTH EXTRACTION      Family History  Problem Relation Age of Onset  . Coronary artery disease Unknown   . Heart disease Father   . Lung cancer Maternal Uncle   . Colon cancer Neg Hx   . Esophageal cancer Neg Hx   . Stomach cancer Neg Hx   . Pancreatic cancer Neg Hx   . Colon polyps Neg Hx      Current Outpatient Medications:  .  lisdexamfetamine (VYVANSE) 20 MG capsule, Take 1 capsule (20 mg total) by mouth daily. (Patient taking differently: Take 20 mg by mouth daily. ), Disp: 30 capsule, Rfl: 0  Current Facility-Administered Medications:  .  0.9 %   sodium chloride infusion, 500 mL, Intravenous, Continuous, Irene Shipper, MD  EXAM:  VITALS per patient if applicable:  GENERAL: alert, oriented, appears well and in no acute distress  HEENT: atraumatic, conjunttiva clear, no obvious abnormalities on inspection of external nose and ears  NECK: normal movements of the head and neck  LUNGS: on inspection no signs of respiratory distress, breathing rate appears normal, no obvious gross SOB, gasping or wheezing  CV: no obvious cyanosis  MS: moves all visible extremities without noticeable abnormality  PSYCH/NEURO: pleasant and cooperative, no obvious depression or anxiety, speech and thought processing grossly intact  ASSESSMENT AND PLAN: ADHD, we will decrease the Vyvanse to 10 mg as needed. Recheck prn.  Alysia Penna, MD  Discussed the following assessment and plan:  No diagnosis found.     I discussed the assessment and treatment plan with the patient. The patient was provided an opportunity to ask questions and all were answered. The patient agreed with the plan and demonstrated an understanding of the instructions.   The patient was advised to call back or seek an in-person evaluation if the symptoms worsen or if the condition fails to improve as anticipated.     Review of Systems     Objective:   Physical Exam  Assessment & Plan:

## 2019-02-07 ENCOUNTER — Telehealth: Payer: Self-pay | Admitting: Family Medicine

## 2019-02-07 NOTE — Telephone Encounter (Signed)
Pt is requesting an in office appt for a bump on the crown of his head that has blood in it that won't heal.  Requesting a call back to discuss in office appt.

## 2019-02-07 NOTE — Telephone Encounter (Signed)
Patient has been scheduled

## 2019-02-11 ENCOUNTER — Encounter: Payer: Self-pay | Admitting: Family Medicine

## 2019-02-11 ENCOUNTER — Ambulatory Visit: Payer: 59 | Admitting: Family Medicine

## 2019-02-11 VITALS — BP 130/60 | HR 75 | Temp 97.9°F | Wt 205.7 lb

## 2019-02-11 DIAGNOSIS — L409 Psoriasis, unspecified: Secondary | ICD-10-CM

## 2019-02-11 NOTE — Progress Notes (Signed)
   Subjective:    Patient ID: Brian Cantrell, male    DOB: 01-18-1966, 53 y.o.   MRN: [id number]  HPI Here to check a patch of skin on the crown of the scalp that has been scaly and irritated for 4 weeks. It does not appear to be spreading.    Review of Systems  Constitutional: Negative.   Respiratory: Negative.   Cardiovascular: Negative.   Skin: Positive for rash.       Objective:   Physical Exam Constitutional:      Appearance: Normal appearance.  Cardiovascular:     Rate and Rhythm: Normal rate and regular rhythm.     Pulses: Normal pulses.     Heart sounds: Normal heart sounds.  Pulmonary:     Effort: Pulmonary effort is normal.     Breath sounds: Normal breath sounds.  Skin:    Comments: The vertex of the scalp has a 2 cm area of erythema with some scaling and some excoriations, no vesicles   Neurological:     Mental Status: He is alert.           Assessment & Plan:  Psoriasis. I offered him a steroid product to treat this be he declined. He and his wife will observe it and he will return of it begins to spread. Alysia Penna, MD

## 2019-06-24 ENCOUNTER — Other Ambulatory Visit: Payer: Self-pay

## 2019-06-24 DIAGNOSIS — M5431 Sciatica, right side: Secondary | ICD-10-CM

## 2019-10-10 ENCOUNTER — Telehealth: Payer: Self-pay | Admitting: Family Medicine

## 2019-10-10 NOTE — Telephone Encounter (Signed)
Pt is requesting to have orders put in so that he can have labs done. Please follow up with pt to schedule.

## 2019-10-11 NOTE — Telephone Encounter (Signed)
I do not know what he has in mind. If this is for physical labs, we need to do these that same day per insurance requirements

## 2019-10-14 NOTE — Telephone Encounter (Signed)
These were ordered as part of a physical, which he should have every year anyway. Have him set up a well exam, come in fasting, and we will get them that day

## 2019-12-09 ENCOUNTER — Encounter: Payer: Self-pay | Admitting: Internal Medicine

## 2020-02-10 ENCOUNTER — Ambulatory Visit (AMBULATORY_SURGERY_CENTER): Payer: 59 | Admitting: *Deleted

## 2020-02-10 ENCOUNTER — Other Ambulatory Visit: Payer: Self-pay

## 2020-02-10 ENCOUNTER — Encounter: Payer: Self-pay | Admitting: Internal Medicine

## 2020-02-10 VITALS — Ht 73.0 in | Wt 205.0 lb

## 2020-02-10 DIAGNOSIS — Z85038 Personal history of other malignant neoplasm of large intestine: Secondary | ICD-10-CM

## 2020-02-10 DIAGNOSIS — Z86010 Personal history of colonic polyps: Secondary | ICD-10-CM

## 2020-02-10 MED ORDER — SUPREP BOWEL PREP KIT 17.5-3.13-1.6 GM/177ML PO SOLN: 1.0000 | Freq: Once | ORAL | 0 refills | Status: AC

## 2020-02-10 NOTE — Progress Notes (Signed)
cov vax x 2 Pt verified name, DOB, address and insurance during PV today. Pt mailed instruction packet to included paper to complete and mail back to [facility] with addressed and stamped envelope, Emmi video, copy of consent form to read and not return, and instructions. [employer]  coupon mailed in packet. PV completed over the phone. Pt encouraged to call with questions or issues - My Chart instructions sent as well as mailed  No egg or soy allergy known to patient  No issues with past sedation with any surgeries or procedures no intubation problems in the past  No FH of Malignant Hyperthermia No diet pills per patient No home 02 use per patient  No blood thinners per patient  Pt denies issues with constipation  No A fib or A flutter  EMMI video to pt or via [place] 19 guidelines implemented in PV today with Pt and RN   sutab  Coupon given to pt in PV today , Code to Pharmacy   Due to the COVID-19 pandemic we are asking patients to follow these guidelines. Please only bring one care partner. Please be aware that your care partner may wait in the car in the parking lot or if they feel like they will be too hot to wait in the car, they may wait in the lobby on the 4th floor. All care partners are required to wear a mask the entire time (we do not have any that we can provide them), they need to practice social distancing, and we will do a Covid check for all patient's and care partners when you arrive. Also we will check their temperature and your temperature. If the care partner waits in their car they need to stay in the parking lot the entire time and we will call them on their cell phone when the patient is ready for discharge so they can bring the car to the front of the building. Also all patient's will need to wear a mask into building.

## 2020-02-24 ENCOUNTER — Ambulatory Visit (AMBULATORY_SURGERY_CENTER): Payer: 59 | Admitting: Internal Medicine

## 2020-02-24 ENCOUNTER — Other Ambulatory Visit: Payer: Self-pay

## 2020-02-24 ENCOUNTER — Encounter: Payer: Self-pay | Admitting: Internal Medicine

## 2020-02-24 VITALS — BP 119/82 | HR 55 | Temp 97.1°F | Resp 12 | Ht 73.0 in

## 2020-02-24 DIAGNOSIS — Z85038 Personal history of other malignant neoplasm of large intestine: Secondary | ICD-10-CM

## 2020-02-24 DIAGNOSIS — Z86010 Personal history of colonic polyps: Secondary | ICD-10-CM | POA: Diagnosis not present

## 2020-02-24 DIAGNOSIS — D122 Benign neoplasm of ascending colon: Secondary | ICD-10-CM

## 2020-02-24 DIAGNOSIS — D123 Benign neoplasm of transverse colon: Secondary | ICD-10-CM | POA: Diagnosis not present

## 2020-02-24 DIAGNOSIS — K5289 Other specified noninfective gastroenteritis and colitis: Secondary | ICD-10-CM | POA: Diagnosis not present

## 2020-02-24 HISTORY — PX: COLONOSCOPY: SHX174

## 2020-02-24 MED ORDER — SODIUM CHLORIDE 0.9 % IV SOLN: 500.0000 mL | Freq: Once | INTRAVENOUS | Status: DC

## 2020-02-24 NOTE — Progress Notes (Signed)
Pt's states no medical or surgical changes since previsit or office visit.  VS CW  

## 2020-02-24 NOTE — Progress Notes (Signed)
To PACU, VSS. Report to [email or web address] 

## 2020-02-24 NOTE — Patient Instructions (Signed)
YOU HAD AN ENDOSCOPIC PROCEDURE TODAY AT THE [facility]:   Refer to the procedure report that was given to you for any specific questions about what was found during the examination.  If the procedure report does not answer your questions, please call your gastroenterologist to clarify.  If you requested that your care partner not be given the details of your procedure findings, then the procedure report has been included in a sealed envelope for you to review at your convenience later.  YOU SHOULD EXPECT: Some feelings of bloating in the abdomen. Passage of more gas than usual.  Walking can help get rid of the air that was put into your GI tract during the procedure and reduce the bloating. If you had a lower endoscopy (such as a colonoscopy or flexible sigmoidoscopy) you may notice spotting of blood in your stool or on the toilet paper. If you underwent a bowel prep for your procedure, you may not have a normal bowel movement for a few days.  Please Note:  You might notice some irritation and congestion in your nose or some drainage.  This is from the oxygen used during your procedure.  There is no need for concern and it should clear up in a day or so.  SYMPTOMS TO REPORT IMMEDIATELY:   Following lower endoscopy (colonoscopy or flexible sigmoidoscopy):  Excessive amounts of blood in the stool  Significant tenderness or worsening of abdominal pains  Swelling of the abdomen that is new, acute  Fever of 100F or higher  For urgent or emergent issues, a gastroenterologist can be reached at any hour by calling [phone]. Do not use MyChart messaging for urgent concerns.    DIET:  We do recommend a small meal at first, but then you may proceed to your regular diet.  Drink plenty of fluids but you should avoid alcoholic beverages for 24 hours.  ACTIVITY:  You should plan to take it easy for the rest of today and you should NOT DRIVE or use heavy machinery until tomorrow (because  of the sedation medicines used during the test).    FOLLOW UP: Our staff will call the number listed on your records 48-72 hours following your procedure to check on you and address any questions or concerns that you may have regarding the information given to you following your procedure. If we do not reach you, we will leave a message.  We will attempt to reach you two times.  During this call, we will ask if you have developed any symptoms of COVID 19. If you develop any symptoms (ie: fever, flu-like symptoms, shortness of breath, cough etc.) before then, please call [phone].  If you test positive for Covid 19 in the 2 weeks post procedure, please call and report this information to us.    If any biopsies were taken you will be contacted by phone or by letter within the next 1-3 weeks.  Please call us at [phone] if you have not heard about the biopsies in 3 weeks.    SIGNATURES/CONFIDENTIALITY: You and/or your care partner have signed paperwork which will be entered into your electronic medical record.  These signatures attest to the fact that that the information above on your After Visit Summary has been reviewed and is understood.  Full responsibility of the confidentiality of this discharge information lies with you and/or your care-partner. 

## 2020-02-24 NOTE — Op Note (Signed)
[facility] Patient Name: Brian Cantrell Procedure Date: 02/24/2020 7:50 AM MRN: [id number] Endoscopist: Docia Chuck. Brian Cantrell , MD Age: 54 Referring MD:  Date of Birth: 1965-11-16 Gender: Male Account #: [id number] Procedure:                Colonoscopy with cold snare polypectomy x 5; with                            biopsies Indications:              High risk colon cancer surveillance: Personal                            history of adenoma (10 mm or greater in size), High                            risk colon cancer surveillance: Personal history of                            multiple (3 or more) adenomas, High risk colon                            cancer surveillance: Personal history of colon                            cancer. 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 Subsequent surveillance                            examinations 2012, 2015, 2018 with multiple and                            advanced adenomas. Medicines:                Monitored Anesthesia Care Procedure:                Pre-Anesthesia Assessment:                           - Prior to the procedure, a History and Physical                            was performed, and patient medications and                            allergies were reviewed. The patient's tolerance of                            previous anesthesia was also reviewed. The risks                            and benefits of the procedure and the sedation  options and risks were discussed with the patient.                            All questions were answered, and informed consent                            was obtained. Prior Anticoagulants: The patient has                            taken no previous anticoagulant or antiplatelet                            agents. ASA Grade Assessment: II - A patient with                            mild systemic disease. After reviewing the risks                             and benefits, the patient was deemed in                            satisfactory condition to undergo the procedure.                           After obtaining informed consent, the colonoscope                            was passed under direct vision. Throughout the                            procedure, the patient's blood pressure, pulse, and                            oxygen saturations were monitored continuously. The                            Colonoscope was introduced through the anus and                            advanced to the the cecum, identified by                            appendiceal orifice and ileocecal valve. The                            ileocecal valve, appendiceal orifice, and rectum                            were photographed. The quality of the bowel                            preparation was excellent. The colonoscopy was  performed without difficulty. The patient tolerated                            the procedure well. The bowel preparation used was                            SUPREP via split dose instruction. Scope In: 8:27:19 AM Scope Out: 8:43:19 AM Scope Withdrawal Time: 0 hours 11 minutes 20 seconds  Total Procedure Duration: 0 hours 16 minutes 0 seconds  Findings:                 Five polyps were found in the transverse colon and                            ascending colon. The polyps were 2 to 7 mm in size.                            These polyps were removed with a cold snare.                            Resection and retrieval were complete.                           The appendiceal orifice was somewhat hyperemic.                            Likely nonneoplastic, but biopsies were taken with                            a cold forceps for histology. The surgical                            anastomosis was located at 28 cm from the anal                            verge and appeared normal. The entire examined                             colon appeared otherwise normal on direct and                            retroflexion views. Complications:            No immediate complications. Estimated blood loss:                            None. Estimated Blood Loss:     Estimated blood loss: none. Impression:               - Five 2 to 7 mm polyps in the transverse colon and                            in the ascending colon, removed with a cold snare.  Resected and retrieved.                           - Hyperemic mucosa at the appendiceal orifice.                            Biopsied                           - Status post sigmoid resection. Otherwise normal                            exam. Recommendation:           - Repeat colonoscopy in 3 years for surveillance.                           - Patient has a contact number available for                            emergencies. The signs and symptoms of potential                            delayed complications were discussed with the                            patient. Return to normal activities tomorrow.                            Written discharge instructions were provided to the                            patient.                           - Resume previous diet.                           - Continue present medications.                           - Await pathology results. Docia Chuck. Brian Pastor, MD 02/24/2020 9:00:18 AM This report has been signed electronically.

## 2020-02-24 NOTE — Progress Notes (Signed)
Called to room to assist during endoscopic procedure.  Patient ID and intended procedure confirmed with present staff. Received instructions for my participation in the procedure from the performing physician.  

## 2020-02-28 ENCOUNTER — Telehealth: Payer: Self-pay | Admitting: *Deleted

## 2020-02-28 ENCOUNTER — Encounter: Payer: Self-pay | Admitting: Internal Medicine

## 2020-02-28 NOTE — Telephone Encounter (Signed)
  Follow up Call-  Call back number 02/24/2020  Post procedure Call Back phone  # [phone]  Permission to leave phone message Yes  Some recent data might be hidden     Patient questions:  Do you have a fever, pain , or abdominal swelling? No. Pain Score  0 *  Have you tolerated food without any problems? Yes.    Have you been able to return to your normal activities? Yes.    Do you have any questions about your discharge instructions: Diet   No. Medications  No. Follow up visit  No.  Do you have questions or concerns about your Care? No.  Actions: * If pain score is 4 or above: No action needed, pain <4.  1. Have you developed a fever since your procedure? no  2.   Have you had an respiratory symptoms (SOB or cough) since your procedure? no  3.   Have you tested positive for COVID 19 since your procedure no  4.   Have you had any family members/close contacts diagnosed with the COVID 19 since your procedure?  no   If yes to any of these questions please route to Joylene John, RN and Joella Prince, RN

## 2020-07-16 ENCOUNTER — Other Ambulatory Visit: Payer: 59

## 2020-07-16 DIAGNOSIS — Z20822 Contact with and (suspected) exposure to covid-19: Secondary | ICD-10-CM

## 2020-07-19 LAB — NOVEL CORONAVIRUS, NAA: SARS-CoV-2, NAA: NOT DETECTED

## 2020-12-25 ENCOUNTER — Other Ambulatory Visit: Payer: Self-pay

## 2020-12-26 ENCOUNTER — Ambulatory Visit (INDEPENDENT_AMBULATORY_CARE_PROVIDER_SITE_OTHER): Payer: 59 | Admitting: Family Medicine

## 2020-12-26 ENCOUNTER — Encounter: Payer: Self-pay | Admitting: Family Medicine

## 2020-12-26 VITALS — BP 124/78 | HR 60 | Temp 98.2°F | Ht 73.0 in | Wt 198.0 lb

## 2020-12-26 DIAGNOSIS — Z0000 Encounter for general adult medical examination without abnormal findings: Secondary | ICD-10-CM | POA: Diagnosis not present

## 2020-12-26 DIAGNOSIS — Z23 Encounter for immunization: Secondary | ICD-10-CM | POA: Diagnosis not present

## 2020-12-26 DIAGNOSIS — Z125 Encounter for screening for malignant neoplasm of prostate: Secondary | ICD-10-CM | POA: Diagnosis not present

## 2020-12-26 LAB — T3, FREE: T3, Free: 3.8 pg/mL (ref 2.3–4.2)

## 2020-12-26 LAB — BASIC METABOLIC PANEL
BUN: 17 mg/dL (ref 6–23)
CO2: 28 mEq/L (ref 19–32)
Calcium: 9.2 mg/dL (ref 8.4–10.5)
Chloride: 105 mEq/L (ref 96–112)
Creatinine, Ser: 0.74 mg/dL (ref 0.40–1.50)
GFR: 100.71 mL/min (ref >60.00)
Glucose, Bld: 91 mg/dL (ref 70–99)
Potassium: 4 mEq/L (ref 3.5–5.1)
Sodium: 140 mEq/L (ref 135–145)

## 2020-12-26 LAB — CBC WITH DIFFERENTIAL/PLATELET
Basophils Absolute: 0 10*3/uL (ref 0.0–0.1)
Basophils Relative: 0.5 % (ref 0.0–3.0)
Eosinophils Absolute: 0.1 10*3/uL (ref 0.0–0.7)
Eosinophils Relative: 1.5 % (ref 0.0–5.0)
HCT: 44.7 % (ref 39.0–52.0)
Hemoglobin: 15.3 g/dL (ref 13.0–17.0)
Lymphocytes Relative: 27.8 % (ref 12.0–46.0)
Lymphs Abs: 1.2 10*3/uL (ref 0.7–4.0)
MCHC: 34.3 g/dL (ref 30.0–36.0)
MCV: 88 fl (ref 78.0–100.0)
Monocytes Absolute: 0.3 10*3/uL (ref 0.1–1.0)
Monocytes Relative: 7.4 % (ref 3.0–12.0)
Neutro Abs: 2.7 10*3/uL (ref 1.4–7.7)
Neutrophils Relative %: 62.8 % (ref 43.0–77.0)
Platelets: 138 10*3/uL — ABNORMAL LOW (ref 150.0–400.0)
RBC: 5.08 Mil/uL (ref 4.22–5.81)
RDW: 12.4 % (ref 11.5–15.5)
WBC: 4.4 10*3/uL (ref 4.0–10.5)

## 2020-12-26 LAB — LIPID PANEL
Cholesterol: 153 mg/dL (ref 0–200)
HDL: 45.4 mg/dL (ref >39.00)
LDL Cholesterol: 90 mg/dL (ref 0–99)
NonHDL: 107.67
Total CHOL/HDL Ratio: 3
Triglycerides: 86 mg/dL (ref 0.0–149.0)
VLDL: 17.2 mg/dL (ref 0.0–40.0)

## 2020-12-26 LAB — HEPATIC FUNCTION PANEL
ALT: 19 U/L (ref 0–53)
AST: 14 U/L (ref 0–37)
Albumin: 4.2 g/dL (ref 3.5–5.2)
Alkaline Phosphatase: 67 U/L (ref 39–117)
Bilirubin, Direct: 0.2 mg/dL (ref 0.0–0.3)
Total Bilirubin: 1.1 mg/dL (ref 0.2–1.2)
Total Protein: 6.5 g/dL (ref 6.0–8.3)

## 2020-12-26 LAB — PSA: PSA: 0.68 ng/mL (ref 0.10–4.00)

## 2020-12-26 LAB — HEMOGLOBIN A1C: Hgb A1c MFr Bld: 5.2 % (ref 4.6–6.5)

## 2020-12-26 LAB — T4, FREE: Free T4: 0.97 ng/dL (ref 0.60–1.60)

## 2020-12-26 LAB — TSH: TSH: 1.47 u[IU]/mL (ref 0.35–4.50)

## 2020-12-26 MED ORDER — ATOMOXETINE HCL 40 MG PO CAPS: 40.0000 mg | ORAL_CAPSULE | Freq: Every day | ORAL | 2 refills | Status: DC

## 2020-12-26 NOTE — Addendum Note (Signed)
Addended by: Elmer Picker on: 12/26/2020 10:04 AM   Modules accepted: Orders

## 2020-12-26 NOTE — Addendum Note (Signed)
Addended by: Wyvonne Lenz on: 12/26/2020 11:08 AM   Modules accepted: Orders

## 2020-12-26 NOTE — Progress Notes (Signed)
Subjective:    Patient ID: Brian Cantrell, male    DOB: [birth year], 55 y.o.   MRN: [id number]  HPI Here for a well exam. He feels fine physically, but he wants to discuss his ADHD. He had been using Vyvanse and this was very helpful for him. However he says the day after taking it he would always have a "crash" where he felt tired and somewhat depressed. He decided not to take this anymore. He still feels he needs something however.    Review of Systems  Constitutional: Negative.   HENT: Negative.    Eyes: Negative.   Respiratory: Negative.    Cardiovascular: Negative.   Gastrointestinal: Negative.   Genitourinary: Negative.   Musculoskeletal: Negative.   Skin: Negative.   Neurological: Negative.   Psychiatric/Behavioral:  Positive for decreased concentration. Negative for confusion and dysphoric mood. The patient is not nervous/anxious.       Objective:   Physical Exam Constitutional:      General: He is not in acute distress.    Appearance: Normal appearance. He is well-developed. He is not diaphoretic.  HENT:     Head: Normocephalic and atraumatic.     Right Ear: External ear normal.     Left Ear: External ear normal.     Nose: Nose normal.     Mouth/Throat:     Pharynx: No oropharyngeal exudate.  Eyes:     General: No scleral icterus.       Right eye: No discharge.        Left eye: No discharge.     Conjunctiva/sclera: Conjunctivae normal.     Pupils: Pupils are equal, round, and reactive to light.  Neck:     Thyroid: No thyromegaly.     Vascular: No JVD.     Trachea: No tracheal deviation.  Cardiovascular:     Rate and Rhythm: Normal rate and regular rhythm.     Heart sounds: Normal heart sounds. No murmur heard.   No friction rub. No gallop.  Pulmonary:     Effort: Pulmonary effort is normal. No respiratory distress.     Breath sounds: Normal breath sounds. No wheezing or rales.  Chest:     Chest wall: No tenderness.  Abdominal:     General: Bowel  sounds are normal. There is no distension.     Palpations: Abdomen is soft. There is no mass.     Tenderness: There is no abdominal tenderness. There is no guarding or rebound.  Genitourinary:    Penis: Normal. No tenderness.      Testes: Normal.     Prostate: Normal.     Rectum: Normal. Guaiac result negative.  Musculoskeletal:        General: No tenderness. Normal range of motion.     Cervical back: Neck supple.  Lymphadenopathy:     Cervical: No cervical adenopathy.  Skin:    General: Skin is warm and dry.     Coloration: Skin is not pale.     Findings: No erythema or rash.  Neurological:     Mental Status: He is alert and oriented to person, place, and time.     Cranial Nerves: No cranial nerve deficit.     Motor: No abnormal muscle tone.     Coordination: Coordination normal.     Deep Tendon Reflexes: Reflexes are normal and symmetric. Reflexes normal.  Psychiatric:        Behavior: Behavior normal.        Thought Content:  Thought content normal.        Judgment: Judgment normal.          Assessment & Plan:  Well exam. We discussed diet and exercise. Get fasting labs. For the ADHD, we will let him try Strattera 40 mg daily. He will report back in 2 weeks.  Alysia Penna, MD

## 2020-12-27 LAB — CEA: CEA: 2.2 ng/mL

## 2021-01-01 ENCOUNTER — Telehealth: Payer: Self-pay

## 2021-01-01 NOTE — Telephone Encounter (Signed)
Key # [id number] Approvedtoday Your PA request has been approved. Additional information will be provided in the approval communication.

## 2021-05-22 ENCOUNTER — Encounter: Payer: Self-pay | Admitting: Family Medicine

## 2021-05-23 NOTE — Telephone Encounter (Signed)
This does not sound like blood clots. These will cause pain and swelling in the legs and feet, but not bruising. Something else is going on. Have him see me OV to evaluate

## 2021-05-29 ENCOUNTER — Ambulatory Visit: Payer: 59 | Admitting: Family Medicine

## 2021-06-03 ENCOUNTER — Ambulatory Visit: Payer: 59 | Admitting: Family Medicine

## 2021-06-03 VITALS — BP 120/64 | HR 65 | Temp 97.8°F | Wt 208.0 lb

## 2021-06-03 DIAGNOSIS — Z23 Encounter for immunization: Secondary | ICD-10-CM

## 2021-06-03 DIAGNOSIS — S8012XA Contusion of left lower leg, initial encounter: Secondary | ICD-10-CM | POA: Diagnosis not present

## 2021-06-03 NOTE — Progress Notes (Signed)
Established Patient Office Visit  Subjective:  Patient ID: Brian Cantrell, male    DOB: Apr 05, 1966  Age: 55 y.o. MRN: [id number]  CC:  Chief Complaint  Patient presents with   Bleeding/Bruising    Behind the left knee, x 2 weeks, has improved some. Thinks this is from driving so much    HPI Brian Cantrell presents for recent pain behind the left knee couple weeks ago.  He was concerned predominantly because of remote history of DVT and pulmonary embolus around 2012 at the time of diagnosis of his colon cancer.  He has not noted any increased edema in his foot, ankle, or leg.  His job requires lots of travel and increased car time.  He did send in some recent pictures of some bruising that have been noted.  He has some bruising along the proximal medial calf region.  Does not recall any specific injury.  No pain with ambulation.  No dyspnea.  No chest pains.  No recent prolonged period of immobility.  Non-smoker.  Currently on no medications.  No known family history of coagulopathy.  Past Medical History:  Diagnosis Date   Adenomatous colon polyp    Allergy    mild   Anemia    related to colon cancer    Colon cancer [facility]) October 2011   sees Dr. Benay Spice    Pulmonary embolism [facility]) April 2012    Past Surgical History:  Procedure Laterality Date   COLON RESECTION  05/2010   sigmoid colectomy per Dr. Excell Seltzer    COLON SURGERY     COLONOSCOPY  02/24/2020   per Dr. [name], adenomatous polyps, repeat in 3 yrs   POLYPECTOMY     WISDOM TOOTH EXTRACTION      Family History  Problem Relation Age of Onset   Coronary artery disease Other    Heart disease Father    Lung cancer Maternal Uncle    Colon polyps Brother    Colon cancer Neg Hx    Esophageal cancer Neg Hx    Stomach cancer Neg Hx    Pancreatic cancer Neg Hx     Social History   Socioeconomic History   Marital status: Married    Spouse name: Not on file   Number of children: Not on file   Years of education:  Not on file   Highest education level: Bachelor's degree (e.g., BA, AB, BS)  Occupational History   Not on file  Tobacco Use   Smoking status: Never   Smokeless tobacco: Never  Substance and Sexual Activity   Alcohol use: Yes    Alcohol/week: 3.0 standard drinks    Types: 3 Cans of beer per week    Comment: on weekends- social    Drug use: No   Sexual activity: Yes  Other Topics Concern   Not on file  Social History Narrative   Not on file   Social Determinants of Health   Financial Resource Strain: Not on file  Food Insecurity: No Food Insecurity   Worried About Running Out of Food in the Last Year: Never true   Ran Out of Food in the Last Year: Never true  Transportation Needs: No Transportation Needs   Lack of Transportation (Medical): No   Lack of Transportation (Non-Medical): No  Physical Activity: Insufficiently Active   Days of Exercise per Week: 1 day   Minutes of Exercise per Session: 30 min  Stress: Stress Concern Present   Feeling of Stress : To  some extent  Social Connections: Engineer, building services of Communication with Friends and Family: More than three times a week   Frequency of Social Gatherings with Friends and Family: Once a week   Attends Religious Services: 1 to 4 times per year   Active Member of [employer] or Organizations: Yes   Attends Music therapist: More than 4 times per year   Marital Status: Married  Human resources officer Violence: Not on file    Outpatient Medications Prior to Visit  Medication Sig Dispense Refill   atomoxetine (STRATTERA) 40 MG capsule Take 1 capsule (40 mg total) by mouth daily. 30 capsule 2   Facility-Administered Medications Prior to Visit  Medication Dose Route Frequency Provider Last Rate Last Admin   0.9 %  sodium chloride infusion  500 mL Intravenous Continuous Irene Shipper, MD        No Known Allergies  ROS Review of Systems  Constitutional:  Negative for chills and fever.  Respiratory:   Negative for shortness of breath.   Cardiovascular:  Negative for chest pain.  Hematological:  Negative for adenopathy. Does not bruise/bleed easily.     Objective:    Physical Exam Vitals reviewed.  Constitutional:      Appearance: Normal appearance.  Cardiovascular:     Rate and Rhythm: Normal rate and regular rhythm.  Pulmonary:     Effort: Pulmonary effort is normal.     Breath sounds: Normal breath sounds.  Musculoskeletal:     Comments: Left knee and leg examined.  He has full range of motion left knee.  No knee effusion.  No medial bruise noted at this time.  No calf tenderness.  No bony tenderness around the knee region.  Neurological:     Mental Status: He is alert.    BP 120/64 (BP Location: Left Arm, Patient Position: Sitting, Cuff Size: Normal)   Pulse 65   Temp 97.8 F (36.6 C) (Oral)   Wt 208 lb (94.3 kg)   SpO2 99%   BMI 27.44 kg/m  Wt Readings from Last 3 Encounters:  06/03/21 208 lb (94.3 kg)  12/26/20 198 lb (89.8 kg)  02/10/20 205 lb (93 kg)     Health Maintenance Due  Topic Date Due   COVID-19 Vaccine (1) Never done   HIV Screening  Never done   Hepatitis C Screening  Never done   Zoster Vaccines- Shingrix (1 of 2) Never done    There are no preventive care reminders to display for this patient.  Lab Results  Component Value Date   TSH 1.47 12/26/2020   Lab Results  Component Value Date   WBC 4.4 12/26/2020   HGB 15.3 12/26/2020   HCT 44.7 12/26/2020   MCV 88.0 12/26/2020   PLT 138.0 (L) 12/26/2020   Lab Results  Component Value Date   NA 140 12/26/2020   K 4.0 12/26/2020   CO2 28 12/26/2020   GLUCOSE 91 12/26/2020   BUN 17 12/26/2020   CREATININE 0.74 12/26/2020   BILITOT 1.1 12/26/2020   ALKPHOS 67 12/26/2020   AST 14 12/26/2020   ALT 19 12/26/2020   PROT 6.5 12/26/2020   ALBUMIN 4.2 12/26/2020   CALCIUM 9.2 12/26/2020   GFR 100.71 12/26/2020   Lab Results  Component Value Date   CHOL 153 12/26/2020   Lab Results   Component Value Date   HDL 45.40 12/26/2020   Lab Results  Component Value Date   LDLCALC 90 12/26/2020   Lab  Results  Component Value Date   TRIG 86.0 12/26/2020   Lab Results  Component Value Date   CHOLHDL 3 12/26/2020   Lab Results  Component Value Date   HGBA1C 5.2 12/26/2020      Assessment & Plan:   Recent bruise left medial calf region without known injury.  No evidence for significant bruising in that region today.  We did not see any evidence for ankle foot or leg edema to suggest likely DVT.  No localized tenderness.  No Baker's cyst.  Knee exam unremarkable.  -Recommend observation for now. -Discussed importance of factors to reduce risk of DVT with long travel such as getting out and stretching frequently and he is aware.  Follow-up immediately for any leg edema or other concerns   No orders of the defined types were placed in this encounter.   Follow-up: No follow-ups on file.    Carolann Littler, MD

## 2021-06-03 NOTE — Patient Instructions (Signed)
Follow up for any increased swelling of leg/ankle/foot or any unexplained shortness of breath.

## 2021-06-05 ENCOUNTER — Ambulatory Visit: Payer: 59 | Admitting: Family Medicine

## 2022-03-21 ENCOUNTER — Ambulatory Visit: Payer: 59 | Admitting: Family Medicine

## 2022-03-21 ENCOUNTER — Encounter: Payer: Self-pay | Admitting: Family Medicine

## 2022-03-21 VITALS — BP 120/80 | HR 65 | Temp 98.0°F | Wt 198.0 lb

## 2022-03-21 DIAGNOSIS — J069 Acute upper respiratory infection, unspecified: Secondary | ICD-10-CM

## 2022-03-21 DIAGNOSIS — L989 Disorder of the skin and subcutaneous tissue, unspecified: Secondary | ICD-10-CM

## 2022-03-21 NOTE — Progress Notes (Signed)
   Subjective:    Patient ID: Brian Cantrell, male    DOB: [birth year], 56 y.o.   MRN: [id number]  HPI Here for several issues. First over the past week he has felt fatigued and has had a dry cough. No fever or ST or sinus congestion. No SOB or body aches. No NVD. He tested negative for the Covid virus yesterday. Also he wants to discuss an area of skin on the left forearm. Over the past year a flat purplish marking has appeared on the dorsal left forearm. This asymptomatic. It lasts for a few days and then disappears again. It is not present today, but he shows me a picture he took of it with his cell phone. This shows a macular purplish spot with well marginated but irregular borders, measuring about 1 cm in diameter.    Review of Systems  Constitutional:  Positive for fatigue. Negative for chills, diaphoresis and fever.  HENT: Negative.    Eyes: Negative.   Respiratory:  Positive for cough. Negative for shortness of breath and wheezing.   Gastrointestinal: Negative.        Objective:   Physical Exam Constitutional:      Appearance: Normal appearance. He is not ill-appearing.  HENT:     Right Ear: Tympanic membrane, ear canal and external ear normal.     Left Ear: Tympanic membrane and ear canal normal.  Skin:    Comments: The skin is clear, including the left forearm   Neurological:     Mental Status: He is alert.           Assessment & Plan:  He has a viral URI. This should resolve on its own. He can rest and drink fluids. Recheck as needed. As for the skin lesion, we will refer him to Dermatology to evaluate.  Alysia Penna, MD

## 2022-04-25 ENCOUNTER — Encounter: Payer: 59 | Admitting: Family Medicine

## 2022-04-29 ENCOUNTER — Ambulatory Visit (INDEPENDENT_AMBULATORY_CARE_PROVIDER_SITE_OTHER): Payer: 59 | Admitting: Family Medicine

## 2022-04-29 ENCOUNTER — Encounter: Payer: Self-pay | Admitting: Family Medicine

## 2022-04-29 VITALS — BP 120/80 | HR 66 | Temp 98.1°F | Wt 203.0 lb

## 2022-04-29 DIAGNOSIS — Z0000 Encounter for general adult medical examination without abnormal findings: Secondary | ICD-10-CM

## 2022-04-29 NOTE — Progress Notes (Signed)
Subjective:    Patient ID: Brian Cantrell, male    DOB: Nov 17, 1965, 56 y.o.   MRN: [id number]  HPI Here for a well exam. In general he is doing well but he wants to discuss some symptoms in the left leg and foot that began about 6 months ago. He describes a resting tremor on the left foot which starts when he is sitting for long periods, such as when he drives his car. He sometimes notices numbness and tingling down the left leg, as well as weakness. He sometimes walks with a slightly uneven gait because of this. There is no pain in the leg. He sometimes has mild low back pain, but nothing severe. No hx of trauma. No family hx of tremors.    Review of Systems  Constitutional: Negative.   HENT: Negative.    Eyes: Negative.   Respiratory: Negative.    Cardiovascular: Negative.   Gastrointestinal: Negative.   Genitourinary: Negative.   Musculoskeletal:  Positive for back pain.  Skin: Negative.   Neurological:  Positive for tremors, weakness and numbness.  Psychiatric/Behavioral: Negative.         Objective:   Physical Exam Constitutional:      General: He is not in acute distress.    Appearance: Normal appearance. He is well-developed. He is not diaphoretic.  HENT:     Head: Normocephalic and atraumatic.     Right Ear: External ear normal.     Left Ear: External ear normal.     Nose: Nose normal.     Mouth/Throat:     Pharynx: No oropharyngeal exudate.  Eyes:     General: No scleral icterus.       Right eye: No discharge.        Left eye: No discharge.     Conjunctiva/sclera: Conjunctivae normal.     Pupils: Pupils are equal, round, and reactive to light.  Neck:     Thyroid: No thyromegaly.     Vascular: No JVD.     Trachea: No tracheal deviation.  Cardiovascular:     Rate and Rhythm: Normal rate and regular rhythm.     Heart sounds: Normal heart sounds. No murmur heard.    No friction rub. No gallop.  Pulmonary:     Effort: Pulmonary effort is normal. No  respiratory distress.     Breath sounds: Normal breath sounds. No wheezing or rales.  Chest:     Chest wall: No tenderness.  Abdominal:     General: Bowel sounds are normal. There is no distension.     Palpations: Abdomen is soft. There is no mass.     Tenderness: There is no abdominal tenderness. There is no guarding or rebound.  Genitourinary:    Penis: Normal. No tenderness.      Testes: Normal.     Prostate: Normal.     Rectum: Normal. Guaiac result negative.  Musculoskeletal:        General: No tenderness. Normal range of motion.     Cervical back: Neck supple.     Comments: The lower back is not tender and ROM is full   Lymphadenopathy:     Cervical: No cervical adenopathy.  Skin:    General: Skin is warm and dry.     Coloration: Skin is not pale.     Findings: No erythema or rash.  Neurological:     Mental Status: He is alert and oriented to person, place, and time.     Cranial Nerves:  No cranial nerve deficit.     Motor: No weakness or abnormal muscle tone.     Coordination: Coordination normal.     Gait: Gait normal.     Deep Tendon Reflexes: Reflexes are normal and symmetric. Reflexes normal.  Psychiatric:        Mood and Affect: Mood normal.        Behavior: Behavior normal.        Thought Content: Thought content normal.        Judgment: Judgment normal.           Assessment & Plan:  Well exam. We discussed diet and exercise. Get fasting labs. He has intermittent numbness and weakness in the left leg, along with a resting tremor. It is not clear if this is due to a sciatic problem or an intrinsic nerve problem. I offered to set up a nerve conduction study or to refer him to Neurology for this, but he declined. He wants to observe this for now, but he will let us know if anything changes.  Alysia Penna, MD

## 2022-05-02 ENCOUNTER — Other Ambulatory Visit (INDEPENDENT_AMBULATORY_CARE_PROVIDER_SITE_OTHER): Payer: 59

## 2022-05-02 DIAGNOSIS — Z0000 Encounter for general adult medical examination without abnormal findings: Secondary | ICD-10-CM | POA: Diagnosis not present

## 2022-05-02 LAB — CBC WITH DIFFERENTIAL/PLATELET
Basophils Absolute: 0 10*3/uL (ref 0.0–0.1)
Basophils Relative: 0.3 % (ref 0.0–3.0)
Eosinophils Absolute: 0.1 10*3/uL (ref 0.0–0.7)
Eosinophils Relative: 1.7 % (ref 0.0–5.0)
HCT: 46.2 % (ref 39.0–52.0)
Hemoglobin: 15.6 g/dL (ref 13.0–17.0)
Lymphocytes Relative: 21.3 % (ref 12.0–46.0)
Lymphs Abs: 1 10*3/uL (ref 0.7–4.0)
MCHC: 33.7 g/dL (ref 30.0–36.0)
MCV: 88.1 fl (ref 78.0–100.0)
Monocytes Absolute: 0.4 10*3/uL (ref 0.1–1.0)
Monocytes Relative: 7.2 % (ref 3.0–12.0)
Neutro Abs: 3.4 10*3/uL (ref 1.4–7.7)
Neutrophils Relative %: 69.5 % (ref 43.0–77.0)
Platelets: 152 10*3/uL (ref 150.0–400.0)
RBC: 5.24 Mil/uL (ref 4.22–5.81)
RDW: 12.7 % (ref 11.5–15.5)
WBC: 4.9 10*3/uL (ref 4.0–10.5)

## 2022-05-02 LAB — BASIC METABOLIC PANEL
BUN: 14 mg/dL (ref 6–23)
CO2: 30 mEq/L (ref 19–32)
Calcium: 9.2 mg/dL (ref 8.4–10.5)
Chloride: 102 mEq/L (ref 96–112)
Creatinine, Ser: 0.79 mg/dL (ref 0.40–1.50)
GFR: 97.81 mL/min (ref >60.00)
Glucose, Bld: 91 mg/dL (ref 70–99)
Potassium: 4 mEq/L (ref 3.5–5.1)
Sodium: 138 mEq/L (ref 135–145)

## 2022-05-02 LAB — LIPID PANEL
Cholesterol: 188 mg/dL (ref 0–200)
HDL: 52.9 mg/dL (ref >39.00)
LDL Cholesterol: 116 mg/dL — ABNORMAL HIGH (ref 0–99)
NonHDL: 134.79
Total CHOL/HDL Ratio: 4
Triglycerides: 94 mg/dL (ref 0.0–149.0)
VLDL: 18.8 mg/dL (ref 0.0–40.0)

## 2022-05-02 LAB — HEPATIC FUNCTION PANEL
ALT: 39 U/L (ref 0–53)
AST: 19 U/L (ref 0–37)
Albumin: 4.3 g/dL (ref 3.5–5.2)
Alkaline Phosphatase: 74 U/L (ref 39–117)
Bilirubin, Direct: 0.2 mg/dL (ref 0.0–0.3)
Total Bilirubin: 1.2 mg/dL (ref 0.2–1.2)
Total Protein: 6.7 g/dL (ref 6.0–8.3)

## 2022-05-02 LAB — PSA: PSA: 0.58 ng/mL (ref 0.10–4.00)

## 2022-05-02 LAB — HEMOGLOBIN A1C: Hgb A1c MFr Bld: 5.3 % (ref 4.6–6.5)

## 2022-05-02 LAB — TSH: TSH: 1.67 u[IU]/mL (ref 0.35–5.50)

## 2022-05-02 LAB — VITAMIN B12: Vitamin B-12: 385 pg/mL (ref 211–911)

## 2022-08-08 ENCOUNTER — Ambulatory Visit: Payer: 59 | Admitting: Family Medicine

## 2022-08-08 VITALS — BP 130/80 | HR 67 | Temp 99.6°F | Wt 203.0 lb

## 2022-08-08 DIAGNOSIS — J029 Acute pharyngitis, unspecified: Secondary | ICD-10-CM | POA: Diagnosis not present

## 2022-08-08 DIAGNOSIS — J020 Streptococcal pharyngitis: Secondary | ICD-10-CM | POA: Diagnosis not present

## 2022-08-08 DIAGNOSIS — R066 Hiccough: Secondary | ICD-10-CM

## 2022-08-08 LAB — POCT RAPID STREP A (OFFICE): Rapid Strep A Screen: POSITIVE — AB

## 2022-08-08 MED ORDER — CEFUROXIME AXETIL 500 MG PO TABS: 500.0000 mg | ORAL_TABLET | Freq: Two times a day (BID) | ORAL | 0 refills | Status: AC

## 2022-08-08 MED ORDER — METOCLOPRAMIDE HCL 10 MG PO TABS: 10.0000 mg | ORAL_TABLET | Freq: Four times a day (QID) | ORAL | 0 refills | Status: DC

## 2022-08-08 NOTE — Progress Notes (Signed)
   Subjective:    Patient ID: Lina Sayre, male    DOB: 1966/04/28, 57 y.o.   MRN: [id number]  HPI Here for several issues. About 4 days ago he developed a severe ST, right ear pain, and a fever. He has been drinking fluids and taking Ibuprofen. Then the next day he began to have hiccups, and he has been hiccupping uncontrollably ever since. No nausea or vomiting.    Review of Systems  Constitutional:  Positive for fever.  HENT:  Positive for congestion, ear pain and sore throat. Negative for postnasal drip and sinus pressure.   Eyes: Negative.   Respiratory: Negative.         Objective:   Physical Exam Constitutional:      Appearance: Normal appearance.     Comments: He hiccups every minute   HENT:     Right Ear: Tympanic membrane, ear canal and external ear normal.     Left Ear: Tympanic membrane, ear canal and external ear normal.     Nose: Nose normal.     Mouth/Throat:     Pharynx: Posterior oropharyngeal erythema present. No oropharyngeal exudate.  Eyes:     Conjunctiva/sclera: Conjunctivae normal.  Cardiovascular:     Rate and Rhythm: Normal rate and regular rhythm.     Pulses: Normal pulses.     Heart sounds: Normal heart sounds.  Pulmonary:     Effort: Pulmonary effort is normal.     Breath sounds: Normal breath sounds.  Lymphadenopathy:     Cervical: Cervical adenopathy present.  Neurological:     Mental Status: He is alert.           Assessment & Plan:  He has a strep pharyngitis, and we will treat this with 10 days of Cefuroxime. He also has hiccups, and we will treat these with Metoclopramide.  Alysia Penna, MD

## 2022-11-25 ENCOUNTER — Encounter: Payer: Self-pay | Admitting: Family Medicine

## 2022-11-25 NOTE — Telephone Encounter (Signed)
This looks like eczema to me. Call in Triamcinolone 0.1% cream to apply BID, 45 gm with 2 rf

## 2022-11-26 MED ORDER — TRIAMCINOLONE ACETONIDE 0.1 % EX CREA: 1.0000 | TOPICAL_CREAM | Freq: Two times a day (BID) | CUTANEOUS | 1 refills | Status: DC

## 2022-11-26 NOTE — Addendum Note (Signed)
Addended by: Claudette Laws D on: 11/26/2022 09:56 AM   Modules accepted: Orders

## 2022-12-08 NOTE — Telephone Encounter (Signed)
Pt sent a message wants to know if he Should I be worried about the underlying causes of his eczema?

## 2022-12-09 NOTE — Telephone Encounter (Signed)
No need to worry about underlying issues. Eczema is very common and is related to the allergy family. Simply treat as needed

## 2022-12-11 NOTE — Telephone Encounter (Signed)
I am sorry I misread your question. You are correct any swelling in the feet or legs needs to be evaluated. Set up an OV soon so we can check on this

## 2022-12-31 ENCOUNTER — Encounter: Payer: Self-pay | Admitting: Internal Medicine

## 2023-01-15 ENCOUNTER — Encounter: Payer: Self-pay | Admitting: Internal Medicine

## 2023-03-12 ENCOUNTER — Encounter: Payer: 59 | Admitting: Internal Medicine

## 2023-03-17 ENCOUNTER — Ambulatory Visit (AMBULATORY_SURGERY_CENTER): Payer: 59

## 2023-03-17 VITALS — Ht 73.0 in | Wt 200.0 lb

## 2023-03-17 DIAGNOSIS — Z85038 Personal history of other malignant neoplasm of large intestine: Secondary | ICD-10-CM

## 2023-03-17 MED ORDER — NA SULFATE-K SULFATE-MG SULF 17.5-3.13-1.6 GM/177ML PO SOLN: 1.0000 | Freq: Once | ORAL | 0 refills | Status: AC

## 2023-03-17 NOTE — Progress Notes (Signed)
No egg or soy allergy known to patient  No issues known to pt with past sedation with any surgeries or procedures Patient denies ever being told they had issues or difficulty with intubation  No FH of Malignant Hyperthermia Pt is not on diet pills Pt is not on  home 02  Pt is not on blood thinners  Pt denies issues with constipation [facility] Senna No A fib or A flutter Have any cardiac testing pending--no Pt can ambulate independently Pt denies use of chewing tobacco Discussed diabetic I weight loss medication holds Discussed NSAID holds Checked BMI Pt instructed to use Singlecare.com or GoodRx for a price reduction on prep  Patient's chart reviewed by Cathlyn Parsons CNRA prior to previsit and patient appropriate for the [facility].  Pre visit completed and red dot placed by patient's name on their procedure day (on provider's schedule).

## 2023-03-19 ENCOUNTER — Encounter: Payer: Self-pay | Admitting: Internal Medicine

## 2023-03-26 ENCOUNTER — Encounter: Payer: 59 | Admitting: Internal Medicine

## 2023-04-02 ENCOUNTER — Ambulatory Visit: Payer: 59 | Admitting: Internal Medicine

## 2023-04-02 ENCOUNTER — Encounter: Payer: Self-pay | Admitting: Internal Medicine

## 2023-04-02 VITALS — BP 131/75 | HR 57 | Temp 98.1°F | Resp 8 | Ht 73.0 in | Wt 200.0 lb

## 2023-04-02 DIAGNOSIS — D123 Benign neoplasm of transverse colon: Secondary | ICD-10-CM | POA: Diagnosis not present

## 2023-04-02 DIAGNOSIS — Z08 Encounter for follow-up examination after completed treatment for malignant neoplasm: Secondary | ICD-10-CM | POA: Diagnosis present

## 2023-04-02 DIAGNOSIS — Z85038 Personal history of other malignant neoplasm of large intestine: Secondary | ICD-10-CM

## 2023-04-02 HISTORY — PX: COLONOSCOPY: SHX174

## 2023-04-02 MED ORDER — SODIUM CHLORIDE 0.9 % IV SOLN: 500.0000 mL | Freq: Once | INTRAVENOUS | Status: DC

## 2023-04-02 NOTE — Progress Notes (Signed)
Pt's states no medical or surgical changes since previsit or office visit. 

## 2023-04-02 NOTE — Progress Notes (Signed)
Called to room to assist during endoscopic procedure.  Patient ID and intended procedure confirmed with present staff. Received instructions for my participation in the procedure from the performing physician.  

## 2023-04-02 NOTE — Patient Instructions (Signed)
Handout provided on polyps.  Resume previous diet.  Continue present medications.  Await pathology results.  Repeat colonoscopy in 5 years for surveillance.   YOU HAD AN ENDOSCOPIC PROCEDURE TODAY AT THE [facility]:   Refer to the procedure report that was given to you for any specific questions about what was found during the examination.  If the procedure report does not answer your questions, please call your gastroenterologist to clarify.  If you requested that your care partner not be given the details of your procedure findings, then the procedure report has been included in a sealed envelope for you to review at your convenience later.  YOU SHOULD EXPECT: Some feelings of bloating in the abdomen. Passage of more gas than usual.  Walking can help get rid of the air that was put into your GI tract during the procedure and reduce the bloating. If you had a lower endoscopy (such as a colonoscopy or flexible sigmoidoscopy) you may notice spotting of blood in your stool or on the toilet paper. If you underwent a bowel prep for your procedure, you may not have a normal bowel movement for a few days.  Please Note:  You might notice some irritation and congestion in your nose or some drainage.  This is from the oxygen used during your procedure.  There is no need for concern and it should clear up in a day or so.  SYMPTOMS TO REPORT IMMEDIATELY:  Following lower endoscopy (colonoscopy or flexible sigmoidoscopy):  Excessive amounts of blood in the stool  Significant tenderness or worsening of abdominal pains  Swelling of the abdomen that is new, acute  Fever of 100F or higher  For urgent or emergent issues, a gastroenterologist can be reached at any hour by calling [phone]. Do not use MyChart messaging for urgent concerns.    DIET:  We do recommend a small meal at first, but then you may proceed to your regular diet.  Drink plenty of fluids but you should avoid alcoholic  beverages for 24 hours.  ACTIVITY:  You should plan to take it easy for the rest of today and you should NOT DRIVE or use heavy machinery until tomorrow (because of the sedation medicines used during the test).    FOLLOW UP: Our staff will call the number listed on your records the next business day following your procedure.  We will call around 7:15- 8:00 am to check on you and address any questions or concerns that you may have regarding the information given to you following your procedure. If we do not reach you, we will leave a message.     If any biopsies were taken you will be contacted by phone or by letter within the next 1-3 weeks.  Please call us at [phone] if you have not heard about the biopsies in 3 weeks.    SIGNATURES/CONFIDENTIALITY: You and/or your care partner have signed paperwork which will be entered into your electronic medical record.  These signatures attest to the fact that that the information above on your After Visit Summary has been reviewed and is understood.  Full responsibility of the confidentiality of this discharge information lies with you and/or your care-partner.

## 2023-04-02 NOTE — Progress Notes (Signed)
Report to PACU, RN, vss, BBS= Clear.  

## 2023-04-02 NOTE — Op Note (Signed)
[facility] Patient Name: Brian Cantrell Procedure Date: 04/02/2023 11:46 AM MRN: [id number] Endoscopist: Wilhemina Bonito. Marina Goodell , MD, [id number] Age: 57 Referring MD:  Date of Birth: March 10, 1966 Gender: Male Account #: [id number] Procedure:                Colonoscopy with snare x 1 Indications:              High risk colon cancer surveillance: Personal                            history of adenoma (10 mm or greater in size), High                            risk colon cancer surveillance: Personal history of                            multiple (3 or more) adenomas, High risk colon                            cancer surveillance: Personal history of colon                            cancer 2011 s/p sigmoid resection. surveillance                            exams 2012, 15, 18, 21 Medicines:                Monitored Anesthesia Care Procedure:                Pre-Anesthesia Assessment:                           - Prior to the procedure, a History and Physical                            was performed, and patient medications and                            allergies were reviewed. The patient's tolerance of                            previous anesthesia was also reviewed. The risks                            and benefits of the procedure and the sedation                            options and risks were discussed with the patient.                            All questions were answered, and informed consent                            was obtained. Prior Anticoagulants: The patient has  taken no anticoagulant or antiplatelet agents. ASA                            Grade Assessment: II - A patient with mild systemic                            disease. After reviewing the risks and benefits,                            the patient was deemed in satisfactory condition to                            undergo the procedure.                           After obtaining informed  consent, the colonoscope                            was passed under direct vision. Throughout the                            procedure, the patient's blood pressure, pulse, and                            oxygen saturations were monitored continuously. The                            Olympus Scope SN: [id number] was introduced through                            the anus and advanced to the the cecum, identified                            by appendiceal orifice and ileocecal valve. The                            ileocecal valve, appendiceal orifice, and rectum                            were photographed. The quality of the bowel                            preparation was excellent. The colonoscopy was                            performed without difficulty. The patient tolerated                            the procedure well. The bowel preparation used was                            SUPREP via split dose instruction. Scope In: 11:56:47 AM Scope Out: 12:08:55 PM Scope Withdrawal Time: 0 hours 9 minutes 47 seconds  Total Procedure  Duration: 0 hours 12 minutes 8 seconds  Findings:                 A 4 mm polyp was found in the transverse colon. The                            polyp was removed with a cold snare. Resection and                            retrieval were complete.                           The exam was otherwise without abnormality on                            direct and retroflexion views. Complications:            No immediate complications. Estimated blood loss:                            None. Estimated Blood Loss:     Estimated blood loss: none. Impression:               - One 4 mm polyp in the transverse colon, removed                            with a cold snare. Resected and retrieved.                           - The examination was otherwise normal on direct                            and retroflexion views. Recommendation:           - Repeat colonoscopy in 5 years for  surveillance.                           - Patient has a contact number available for                            emergencies. The signs and symptoms of potential                            delayed complications were discussed with the                            patient. Return to normal activities tomorrow.                            Written discharge instructions were provided to the                            patient.                           - Resume previous diet.                           -  Continue present medications.                           - Await pathology results. Wilhemina Bonito. Marina Goodell, MD 04/02/2023 12:14:51 PM This report has been signed electronically.

## 2023-04-02 NOTE — Progress Notes (Signed)
HISTORY OF PRESENT ILLNESS:  Brian Cantrell is a 56 y.o. male with a personal history of colon cancer and multiple adenomatous colon polyps.  Now for surveillance colonoscopy  REVIEW OF SYSTEMS:  All non-GI ROS negative except for  Past Medical History:  Diagnosis Date   Adenomatous colon polyp    Allergy    mild   Anemia    related to colon cancer    Colon cancer [facility]) October 2011   sees Dr. Truett Perna    Pulmonary embolism [facility]) April 2012    Past Surgical History:  Procedure Laterality Date   COLON RESECTION  05/2010   sigmoid colectomy per Dr. Johna Sheriff    COLON SURGERY     COLONOSCOPY  02/24/2020   per Dr. Marina Goodell, adenomatous polyps, repeat in 3 yrs   POLYPECTOMY     WISDOM TOOTH EXTRACTION      Social History Brian Cantrell  reports that he has never smoked. He has never used smokeless tobacco. He reports current alcohol use of about 3.0 standard drinks of alcohol per week. He reports that he does not use drugs.  family history includes Colon polyps in his brother; Coronary artery disease in an other family member; Heart disease in his father; Lung cancer in his maternal uncle.  No Known Allergies     PHYSICAL EXAMINATION: Vital signs: BP (!) 144/81   Pulse 65   Temp 98.1 F (36.7 C) (Temporal)   Ht 6\' 1"  (1.854 m)   Wt 200 lb (90.7 kg)   SpO2 97%   BMI 26.39 kg/m  General: Well-developed, well-nourished, no acute distress HEENT: Sclerae are anicteric, conjunctiva pink. Oral mucosa intact Lungs: Clear Heart: Regular Abdomen: soft, nontender, nondistended, no obvious ascites, no peritoneal signs, normal bowel sounds. No organomegaly. Extremities: No edema Psychiatric: alert and oriented x3. Cooperative     ASSESSMENT:  History of colon cancer and multiple colon polyps   PLAN:   Surveillance colonoscopy

## 2023-04-03 ENCOUNTER — Telehealth: Payer: Self-pay | Admitting: *Deleted

## 2023-04-03 NOTE — Telephone Encounter (Signed)
  Follow up Call-     04/02/2023   10:56 AM  Call back number  Post procedure Call Back phone  # [phone]  Permission to leave phone message Yes     Patient questions:  Do you have a fever, pain , or abdominal swelling? No. Pain Score  0 *  Have you tolerated food without any problems? Yes.    Have you been able to return to your normal activities? Yes.    Do you have any questions about your discharge instructions: Diet   No. Medications  No. Follow up visit  No.  Do you have questions or concerns about your Care? No.  Actions: * If pain score is 4 or above: No action needed, pain <4.

## 2023-04-07 ENCOUNTER — Encounter: Payer: Self-pay | Admitting: Internal Medicine

## 2023-04-07 LAB — SURGICAL PATHOLOGY

## 2023-11-24 ENCOUNTER — Encounter: Payer: Self-pay | Admitting: Neurology

## 2023-11-24 ENCOUNTER — Ambulatory Visit (INDEPENDENT_AMBULATORY_CARE_PROVIDER_SITE_OTHER): Admitting: Family Medicine

## 2023-11-24 ENCOUNTER — Encounter: Payer: Self-pay | Admitting: Family Medicine

## 2023-11-24 VITALS — BP 128/76 | HR 64 | Temp 98.2°F | Ht 72.5 in | Wt 198.0 lb

## 2023-11-24 DIAGNOSIS — Z0000 Encounter for general adult medical examination without abnormal findings: Secondary | ICD-10-CM

## 2023-11-24 DIAGNOSIS — R29818 Other symptoms and signs involving the nervous system: Secondary | ICD-10-CM | POA: Diagnosis not present

## 2023-11-24 DIAGNOSIS — Z209 Contact with and (suspected) exposure to unspecified communicable disease: Secondary | ICD-10-CM

## 2023-11-24 NOTE — Progress Notes (Signed)
 Subjective:    Patient ID: Brian Cantrell, male    DOB: [birth year], 58 y.o.   MRN: [id number]  HPI Here for a well exam. His main concern is a constellation of symptoms that have appeared in the past year. We have discussed a slight tremor in the left leg the past few years, but this has become more pronounced. In addition, he has shakiness in his hands, his body feels stiff, and it takes him much longer to perform simple tasks than it used to (for example getting dressed and putting on shoes). His family has noticed that his voice has become much softer and they often ask him to repeat what he says.    Review of Systems  Constitutional: Negative.   HENT: Negative.    Eyes: Negative.   Respiratory: Negative.    Cardiovascular: Negative.   Gastrointestinal: Negative.   Genitourinary: Negative.   Musculoskeletal: Negative.   Skin: Negative.   Neurological:  Positive for tremors, speech difficulty and weakness.  Psychiatric/Behavioral: Negative.         Objective:   Physical Exam Constitutional:      General: He is not in acute distress.    Appearance: Normal appearance. He is well-developed. He is not diaphoretic.  HENT:     Head: Normocephalic and atraumatic.     Right Ear: External ear normal.     Left Ear: External ear normal.     Nose: Nose normal.     Mouth/Throat:     Pharynx: No oropharyngeal exudate.  Eyes:     General: No scleral icterus.       Right eye: No discharge.        Left eye: No discharge.     Conjunctiva/sclera: Conjunctivae normal.     Pupils: Pupils are equal, round, and reactive to light.  Neck:     Thyroid : No thyromegaly.     Vascular: No JVD.     Trachea: No tracheal deviation.  Cardiovascular:     Rate and Rhythm: Normal rate and regular rhythm.     Pulses: Normal pulses.     Heart sounds: Normal heart sounds. No murmur heard.    No friction rub. No gallop.  Pulmonary:     Effort: Pulmonary effort is normal. No respiratory distress.      Breath sounds: Normal breath sounds. No wheezing or rales.  Chest:     Chest wall: No tenderness.  Abdominal:     General: Bowel sounds are normal. There is no distension.     Palpations: Abdomen is soft. There is no mass.     Tenderness: There is no abdominal tenderness. There is no guarding or rebound.  Genitourinary:    Penis: Normal. No tenderness.      Testes: Normal.     Prostate: Normal.     Rectum: Normal. Guaiac result negative.  Musculoskeletal:        General: No tenderness. Normal range of motion.     Cervical back: Neck supple.  Lymphadenopathy:     Cervical: No cervical adenopathy.  Skin:    General: Skin is warm and dry.     Coloration: Skin is not pale.     Findings: No erythema or rash.  Neurological:     General: No focal deficit present.     Mental Status: He is alert and oriented to person, place, and time.     Cranial Nerves: No cranial nerve deficit.     Motor: No abnormal muscle tone.  Coordination: Coordination normal.     Gait: Gait normal.     Deep Tendon Reflexes: Reflexes are normal and symmetric. Reflexes normal.     Comments: He does move somewhat slowly and walks slowly down the hall. His voice is quiet and somewhat monotonous   Psychiatric:        Mood and Affect: Mood normal.        Behavior: Behavior normal.        Thought Content: Thought content normal.        Judgment: Judgment normal.           Assessment & Plan:  Well exam. We discussed diet and exercise. Get fasting labs. He has developed a number of Parkinsonian symptoms, so we will refer him to Neurology to evaluate this further.  Corita Diego, MD

## 2023-11-25 ENCOUNTER — Other Ambulatory Visit (INDEPENDENT_AMBULATORY_CARE_PROVIDER_SITE_OTHER)

## 2023-11-25 DIAGNOSIS — Z0000 Encounter for general adult medical examination without abnormal findings: Secondary | ICD-10-CM | POA: Diagnosis not present

## 2023-11-25 DIAGNOSIS — Z209 Contact with and (suspected) exposure to unspecified communicable disease: Secondary | ICD-10-CM

## 2023-11-25 DIAGNOSIS — Z131 Encounter for screening for diabetes mellitus: Secondary | ICD-10-CM | POA: Diagnosis not present

## 2023-11-25 DIAGNOSIS — Z125 Encounter for screening for malignant neoplasm of prostate: Secondary | ICD-10-CM

## 2023-11-25 LAB — CBC WITH DIFFERENTIAL/PLATELET
Basophils Absolute: 0 10*3/uL (ref 0.0–0.1)
Basophils Relative: 0.4 % (ref 0.0–3.0)
Eosinophils Absolute: 0 10*3/uL (ref 0.0–0.7)
Eosinophils Relative: 0.5 % (ref 0.0–5.0)
HCT: 46.1 % (ref 39.0–52.0)
Hemoglobin: 15.8 g/dL (ref 13.0–17.0)
Lymphocytes Relative: 19.6 % (ref 12.0–46.0)
Lymphs Abs: 0.9 10*3/uL (ref 0.7–4.0)
MCHC: 34.2 g/dL (ref 30.0–36.0)
MCV: 86.6 fl (ref 78.0–100.0)
Monocytes Absolute: 0.3 10*3/uL (ref 0.1–1.0)
Monocytes Relative: 7.3 % (ref 3.0–12.0)
Neutro Abs: 3.4 10*3/uL (ref 1.4–7.7)
Neutrophils Relative %: 72.2 % (ref 43.0–77.0)
Platelets: 136 10*3/uL — ABNORMAL LOW (ref 150.0–400.0)
RBC: 5.33 Mil/uL (ref 4.22–5.81)
RDW: 12.4 % (ref 11.5–15.5)
WBC: 4.8 10*3/uL (ref 4.0–10.5)

## 2023-11-25 LAB — HEPATIC FUNCTION PANEL
ALT: 19 U/L (ref 0–53)
AST: 15 U/L (ref 0–37)
Albumin: 4.4 g/dL (ref 3.5–5.2)
Alkaline Phosphatase: 68 U/L (ref 39–117)
Bilirubin, Direct: 0.1 mg/dL (ref 0.0–0.3)
Total Bilirubin: 0.8 mg/dL (ref 0.2–1.2)
Total Protein: 6.8 g/dL (ref 6.0–8.3)

## 2023-11-25 LAB — BASIC METABOLIC PANEL WITH GFR
BUN: 16 mg/dL (ref 6–23)
CO2: 29 meq/L (ref 19–32)
Calcium: 9.4 mg/dL (ref 8.4–10.5)
Chloride: 102 meq/L (ref 96–112)
Creatinine, Ser: 0.88 mg/dL (ref 0.40–1.50)
GFR: 93.64 mL/min (ref >60.00)
Glucose, Bld: 94 mg/dL (ref 70–99)
Potassium: 4.2 meq/L (ref 3.5–5.1)
Sodium: 139 meq/L (ref 135–145)

## 2023-11-25 LAB — LIPID PANEL
Cholesterol: 168 mg/dL (ref 0–200)
HDL: 52.3 mg/dL (ref >39.00)
LDL Cholesterol: 99 mg/dL (ref 0–99)
NonHDL: 115.68
Total CHOL/HDL Ratio: 3
Triglycerides: 83 mg/dL (ref 0.0–149.0)
VLDL: 16.6 mg/dL (ref 0.0–40.0)

## 2023-11-25 LAB — PSA: PSA: 0.76 ng/mL (ref 0.10–4.00)

## 2023-11-25 LAB — HEMOGLOBIN A1C: Hgb A1c MFr Bld: 5.2 % (ref 4.6–6.5)

## 2023-11-25 LAB — TSH: TSH: 1.27 u[IU]/mL (ref 0.35–5.50)

## 2023-11-26 ENCOUNTER — Ambulatory Visit: Payer: Self-pay | Admitting: Family Medicine

## 2023-11-26 ENCOUNTER — Telehealth: Payer: Self-pay | Admitting: *Deleted

## 2023-11-26 NOTE — Telephone Encounter (Signed)
 Copied from CRM [id number]. Topic: Clinical - Lab/Test Results >> Nov 26, 2023  3:16 PM Star [place] wrote: Reason for CRM: gave results verbatim, no further questions

## 2023-11-27 LAB — MEASLES/MUMPS/RUBELLA IMMUNITY
Mumps IgG: 219 [AU]/ml
Rubella: <0.9 {index} — ABNORMAL LOW
Rubeola IgG: 174 [AU]/ml

## 2023-12-07 NOTE — Progress Notes (Unsigned)
 Assessment/Plan:   Parkinsonism.  I suspect that this does represent idiopathic Parkinson's disease.  The patient has tremor, bradykinesia, rigidity and mild postural instability.  -We discussed the diagnosis as well as pathophysiology of the disease.  We discussed treatment options as well as prognostic indicators.  Patient education was provided.  -pt with ? exposure to TCE which is occupational hazard  -We discussed that it used to be thought that levodopa would increase risk of melanoma but now it is believed that Parkinsons itself likely increases risk of melanoma. he is to get regular skin checks.  -We decided to add carbidopa/levodopa 25/100.  1/2 tab tid x 1 wk, then 1/2 in am & noon & 1 at night for a week, then 1/2 in am &1 at noon &night for a week, then 1 po tid.  Risks, benefits, side effects and alternative therapies were discussed.  The opportunity to ask questions was given and they were answered to the best of my ability.  The patient expressed understanding and willingness to follow the outlined treatment protocols.  -2nd opinion offered  -he still works (at least for next 90 days) so holding on neurorehab PT referrals  -We discussed community resources in the area including patient support groups and community exercise programs for PD and pt education was provided to the patient.   -MRI brain with and without given hx of colon CA  2. Constipation  -discussed nature and pathophysiology and association with PD  -discussed importance of hydration.  Pt is to increase water intake  -pt is given a copy of the rancho recipe  -recommended daily colace  -recommended miralax prn   3.  Sialorrhea  -discussed botox if worsens in future  4.  Vasomotor rhinorrhea  -discussed nature and pathophysiology although tx is limited  Subjective:   Brian Cantrell was seen today in the movement disorders clinic for neurologic consultation at the request of Donley Furth, MD.  The  consultation is for the evaluation of left leg tremor for several years and possible parkinsonian features.   Specific Symptoms:  Tremor: Yes.   started in the left leg in early 2023 but has some in the L arm as well.  Notes it when in the car and when distracted Family hx of similar:  No. (Has an uncle with dementia but no Parkinsons Disease) Voice: its gotten softer - "I talk in hush tones" Sleep: frequent awakenings  Vivid Dreams:  No.  Acting out dreams:  No. Wet Pillows: No. Postural symptoms:  Yes.  , he is part of a walking group and yesterday they walked 3 miles and by the end he felt like he was being propelled forward  Falls?  No. Bradykinesia symptoms: shuffling gait, drooling while awake, and difficulty getting out of a chair Loss of smell:  No. Loss of taste:  No. Urinary Incontinence:  has bladder frequency and urgency but not incontinence Constipation: yes Difficulty Swallowing:  No. Handwriting, micrographia: "I have to focus on making it legible" Trouble with ADL's:  he's slow; sits to put on pants  Trouble buttoning clothing: No. Depression:  No. But admits to anxiety Memory changes:  "its scattered" - attributes to stress and work Hallucinations:  No.  visual distortions: No. N/V:  No. Lightheaded:  No.  Syncope: No. Diplopia:  No. Dyskinesia:  No. Prior exposure to reglan /antipsychotics: No.  Neuroimaging of the brain has not been done since 2009   ALLERGIES:  No Known Allergies  CURRENT  MEDICATIONS:  Current Meds  Medication Sig   carbidopa-levodopa (SINEMET IR) 25-100 MG tablet 7am/11am/4pm     Objective:   VITALS:   Vitals:   12/09/23 0844  BP: 124/76  Pulse: 74  SpO2: 98%  Weight: 196 lb 12.8 oz (89.3 kg)  Height: 6' (1.829 m)    GEN:  The patient appears stated age and is in NAD. HEENT:  Normocephalic, atraumatic.  The mucous membranes are moist. The superficial temporal arteries are without ropiness or tenderness. CV:  RRR Lungs:   CTAB Neck/HEME:  There are no carotid bruits bilaterally.  Neurological examination:  Orientation: The patient is alert and oriented x3.  Cranial nerves: There is good facial symmetry. there is facial hypomimia.  Extraocular muscles are intact. The visual fields are full to confrontational testing. The speech is fluent and clear. Mild hypomimia.  Soft palate rises symmetrically and there is no tongue deviation. Hearing is intact to conversational tone. Sensation: Sensation is intact to light and pinprick throughout (facial, trunk, extremities). Vibration is intact at the bilateral big toe. There is no extinction with double simultaneous stimulation. There is no sensory dermatomal level identified. Motor: Strength is 5/5 in the bilateral upper and lower extremities.   Shoulder shrug is equal and symmetric.  There is no pronator drift. Deep tendon reflexes: Deep tendon reflexes are 2/4 at the bilateral biceps, triceps, brachioradialis, patella and achilles. Plantar responses are downgoing bilaterally.  Movement examination: Tone: There is mild to mod increased tone in the LUE Abnormal movements: none seen but mild tremor is felt on the L with distraction Coordination:  There is decremation with RAM's, with finger taps and hand opening and closing on the L.  All other RAMs are normal bilaterally Gait and Station: The patient has no difficulty arising out of a deep-seated chair without the use of the hands. He ambulates on his toes and is forward flexed with decreased arm swing on the L.  He has good stride length.  The patient has a positive  pull test.     I have reviewed and interpreted the following labs independently   Chemistry      Component Value Date/Time   NA 139 11/25/2023 0923   K 4.2 11/25/2023 0923   CL 102 11/25/2023 0923   CO2 29 11/25/2023 0923   BUN 16 11/25/2023 0923   CREATININE 0.88 11/25/2023 0923      Component Value Date/Time   CALCIUM 9.4 11/25/2023 0923   ALKPHOS 68  11/25/2023 0923   AST 15 11/25/2023 0923   ALT 19 11/25/2023 0923   BILITOT 0.8 11/25/2023 0923      Lab Results  Component Value Date   TSH 1.27 11/25/2023   Lab Results  Component Value Date   WBC 4.8 11/25/2023   HGB 15.8 11/25/2023   HCT 46.1 11/25/2023   MCV 86.6 11/25/2023   PLT 136.0 (L) 11/25/2023     Total time spent on today's visit was 60 minutes, including both face-to-face time and nonface-to-face time.  Time included that spent on review of records (prior notes available to me/labs/imaging if pertinent), discussing treatment and goals, answering patient's questions and coordinating care.  Cc:  Donley Furth, MD

## 2023-12-09 ENCOUNTER — Encounter: Payer: Self-pay | Admitting: Neurology

## 2023-12-09 ENCOUNTER — Ambulatory Visit: Admitting: Neurology

## 2023-12-09 ENCOUNTER — Other Ambulatory Visit: Payer: Self-pay | Admitting: Neurology

## 2023-12-09 VITALS — BP 124/76 | HR 74 | Ht 72.0 in | Wt 196.8 lb

## 2023-12-09 DIAGNOSIS — G20A1 Parkinson's disease without dyskinesia, without mention of fluctuations: Secondary | ICD-10-CM | POA: Diagnosis not present

## 2023-12-09 DIAGNOSIS — Z859 Personal history of malignant neoplasm, unspecified: Secondary | ICD-10-CM

## 2023-12-09 MED ORDER — CARBIDOPA-LEVODOPA 25-100 MG PO TABS: ORAL_TABLET | ORAL | 1 refills | Status: DC

## 2023-12-09 NOTE — Patient Instructions (Addendum)
 Start Carbidopa Levodopa as follows: Take 1/2 tablet three times daily, at least 30 minutes before meals (approximately 7am/11am/4pm), for one week Then take 1/2 tablet in the morning, 1/2 tablet in the afternoon, 1 tablet in the evening, at least 30 minutes before meals, for one week Then take 1/2 tablet in the morning, 1 tablet in the afternoon, 1 tablet in the evening, at least 30 minutes before meals, for one week Then take 1 tablet three times daily at 7am/11am/4pm, at least 30 minutes before meals   As a reminder, carbidopa/levodopa can be taken at the same time as a carbohydrate, but we like to have you take your pill either 30 minutes before a protein source or 1 hour after as protein can interfere with carbidopa/levodopa absorption.  SAVE THE DATE!  We will do a Parkinsons Disease symposium on 9/19 at the [facility].  Constipation and Parkinson's disease:  1.Rancho recipe for constipation in Parkinsons Disease:  -1 cup of unprocessed bran (need to get this at [employer], [employer] or similar type of store), 2 cups of applesauce in 1 cup of prune juice 2.  Increase fiber intake (Metamucil,vegetables) 3.  Regular, moderate exercise can be beneficial. 4.  Avoid medications causing constipation, such as medications like antacids with calcium or magnesium 5.  It's okay to take daily Miralax, and taper if stools become too loose or you experience diarrhea 6.  Stool softeners (Colace) can help with chronic constipation and I recommend you take this daily. 7.  Increase water intake.  You should be drinking 1/2 gallon of water a day as long as you have not been diagnosed with congestive heart failure or renal/kidney failure.  This is probably the single greatest thing that you can do to help your constipation.

## 2023-12-24 ENCOUNTER — Encounter: Payer: Self-pay | Admitting: Family Medicine

## 2023-12-24 ENCOUNTER — Ambulatory Visit
Admission: RE | Admit: 2023-12-24 | Discharge: 2023-12-24 | Disposition: A | Discharge status: Home / Self Care | Source: Ambulatory Visit | Attending: Neurology | Admitting: Neurology

## 2023-12-24 DIAGNOSIS — Z859 Personal history of malignant neoplasm, unspecified: Secondary | ICD-10-CM

## 2023-12-24 DIAGNOSIS — G20A1 Parkinson's disease without dyskinesia, without mention of fluctuations: Secondary | ICD-10-CM

## 2023-12-24 MED ORDER — GADOPICLENOL 0.5 MMOL/ML IV SOLN
10.0000 mL | Freq: Once | INTRAVENOUS | Status: AC | PRN
  Administered 2023-12-24: 10 mL via INTRAVENOUS

## 2023-12-25 NOTE — Telephone Encounter (Signed)
 This sounds like either allergies or a simple head cold. Drink fluids and take an antihistamine like Zyrtec as needed. If it gets worse (fever or cough) make an OV

## 2023-12-28 ENCOUNTER — Ambulatory Visit: Payer: Self-pay | Admitting: Neurology

## 2024-02-29 NOTE — Progress Notes (Unsigned)
 Assessment/Plan:   Parkinsons disease, diagnosed June, 2025             -pt with ? exposure to TCE which is occupational hazard             - Continue carbidopa /levodopa  25/100, 1 tablet 3 times per day.  Improved on levodopa   -proud of him for all of the exercise.               -We discussed community resources in the area including patient support groups and community exercise programs for PD and pt education was provided to the patient.   -discussed issues surrounding sleep, mood, exercise, etc  -invited to Parkinsons Disease symposium and encouraged to attend  -wife not here previously so answered questions to the best of my ability   2. F/u 6 months  Subjective:   Brian Cantrell was seen today in follow up for Parkinsons disease.  My previous records were reviewed prior to todays visit as well as outside records available to me.  Pt with wife who supplements hx.  The diagnosis was made early June.  He was started on levodopa . He notes its helpful.  I would notice it if I missed it.   Left leg tremor is much less per pt and wife.  Pt denies falls.  Pt with occ lightheadedness if he gets up too quick but no near syncope.  No hallucinations.  He is exercising really faithfully on his peloton.   He had an MRI of the brain since last visit.  That was unremarkable, with the exception of maxillary sinus disease.  They ask about sleep, mood, exercise.  Current prescribed movement disorder medications: Carbidopa /levodopa  25/100, 1 tablet 3 times per day (started last visit)  ALLERGIES:  No Known Allergies  CURRENT MEDICATIONS:  Current Meds  Medication Sig   carbidopa -levodopa  (SINEMET  IR) 25-100 MG tablet 1 po tid at 7 / 11 / 4pm   [DISCONTINUED] carbidopa -levodopa  (SINEMET  IR) 25-100 MG tablet 1/2 tab tid x 1 wk, then 1/2 in am & noon & 1 at night for a week, then 1/2 in am &1 at noon &night for a week, then 1 po tid at 7 / 11 / 4pm     Objective:   PHYSICAL EXAMINATION:     VITALS:   Vitals:   03/02/24 0854  BP: 132/68  Pulse: 73  SpO2: 98%  Weight: 198 lb 9.6 oz (90.1 kg)  Height: 6' (1.829 m)    GEN:  The patient appears stated age and is in NAD. HEENT:  Normocephalic, atraumatic.  The mucous membranes are moist. The superficial temporal arteries are without ropiness or tenderness. CV:  RRR Lungs:  CTAB Neck/HEME:  There are no carotid bruits bilaterally.  Neurological examination:  Orientation: The patient is alert and oriented x3. Cranial nerves: There is good facial symmetry with mild facial hypomimia. The speech is fluent and clear. Soft palate rises symmetrically and there is no tongue deviation. Hearing is intact to conversational tone. Sensation: Sensation is intact to light touch throughout Motor: Strength is at least antigravity x4.  Movement examination: Tone: There is nl tone in the UE/LE Abnormal movements: none  Coordination:  There is no significant decremation with any form of RAMS, including alternating supination and pronation of the forearm, hand opening and closing, finger taps, heel taps and toe taps.  Gait and Station: The patient has no difficulty arising out of a deep-seated chair without the use of the hands.  He is a bit forward flexed with purposeful arm swing.     I have reviewed and interpreted the following labs independently    Chemistry      Component Value Date/Time   NA 139 11/25/2023 0923   K 4.2 11/25/2023 0923   CL 102 11/25/2023 0923   CO2 29 11/25/2023 0923   BUN 16 11/25/2023 0923   CREATININE 0.88 11/25/2023 0923      Component Value Date/Time   CALCIUM 9.4 11/25/2023 0923   ALKPHOS 68 11/25/2023 0923   AST 15 11/25/2023 0923   ALT 19 11/25/2023 0923   BILITOT 0.8 11/25/2023 0923       Lab Results  Component Value Date   WBC 4.8 11/25/2023   HGB 15.8 11/25/2023   HCT 46.1 11/25/2023   MCV 86.6 11/25/2023   PLT 136.0 (L) 11/25/2023    Lab Results  Component Value Date   TSH 1.27  11/25/2023     Total time spent on today's visit was 40 minutes, including both face-to-face time and nonface-to-face time.  Time included that spent on review of records (prior notes available to me/labs/imaging if pertinent), discussing treatment and goals, answering patient's questions and coordinating care.  Cc:  Johnny Garnette LABOR, MD

## 2024-03-02 ENCOUNTER — Encounter: Payer: Self-pay | Admitting: Neurology

## 2024-03-02 ENCOUNTER — Ambulatory Visit: Admitting: Neurology

## 2024-03-02 DIAGNOSIS — G20A1 Parkinson's disease without dyskinesia, without mention of fluctuations: Secondary | ICD-10-CM

## 2024-03-02 MED ORDER — CARBIDOPA-LEVODOPA 25-100 MG PO TABS: ORAL_TABLET | ORAL | Status: DC

## 2024-03-02 NOTE — Patient Instructions (Signed)

## 2024-06-08 ENCOUNTER — Other Ambulatory Visit: Payer: Self-pay | Admitting: Neurology

## 2024-06-08 DIAGNOSIS — G20A1 Parkinson's disease without dyskinesia, without mention of fluctuations: Secondary | ICD-10-CM

## 2024-09-08 ENCOUNTER — Ambulatory Visit: Admitting: Neurology

## 2024-10-10 ENCOUNTER — Ambulatory Visit: Admitting: Neurology
